# Patient Record
Sex: Female | Born: 1940 | Race: White | Hispanic: No | Marital: Single | Smoking: Former smoker
Health system: Southern US, Community
[De-identification: ages and names within clinical notes are randomized; demographics above are authoritative.]

## PROBLEM LIST (undated history)

## (undated) DIAGNOSIS — Z923 Personal history of irradiation: Secondary | ICD-10-CM

## (undated) DIAGNOSIS — C50919 Malignant neoplasm of unspecified site of unspecified female breast: Secondary | ICD-10-CM

## (undated) DIAGNOSIS — IMO0001 Reserved for inherently not codable concepts without codable children: Secondary | ICD-10-CM

## (undated) DIAGNOSIS — IMO0002 Reserved for concepts with insufficient information to code with codable children: Secondary | ICD-10-CM

## (undated) DIAGNOSIS — Z9889 Other specified postprocedural states: Secondary | ICD-10-CM

## (undated) DIAGNOSIS — F329 Major depressive disorder, single episode, unspecified: Secondary | ICD-10-CM

## (undated) DIAGNOSIS — Z9289 Personal history of other medical treatment: Secondary | ICD-10-CM

## (undated) DIAGNOSIS — E039 Hypothyroidism, unspecified: Secondary | ICD-10-CM

## (undated) DIAGNOSIS — F32A Depression, unspecified: Secondary | ICD-10-CM

## (undated) HISTORY — PX: TONSILLECTOMY: SUR1361

## (undated) HISTORY — DX: Personal history of other medical treatment: Z92.89

## (undated) HISTORY — DX: Malignant neoplasm of unspecified site of unspecified female breast: C50.919

## (undated) HISTORY — PX: ABDOMINAL HYSTERECTOMY: SHX81

## (undated) HISTORY — PX: EYE SURGERY: SHX253

## (undated) HISTORY — PX: CATARACT EXTRACTION: SUR2

## (undated) HISTORY — DX: Other specified postprocedural states: Z98.890

---

## 2003-12-31 ENCOUNTER — Ambulatory Visit (HOSPITAL_COMMUNITY): Admission: RE | Admit: 2003-12-31 | Discharge: 2004-01-01 | Payer: Self-pay | Admitting: Ophthalmology

## 2005-04-16 ENCOUNTER — Other Ambulatory Visit: Admission: RE | Admit: 2005-04-16 | Discharge: 2005-04-16 | Payer: Self-pay | Admitting: Internal Medicine

## 2005-04-22 ENCOUNTER — Encounter: Admission: RE | Admit: 2005-04-22 | Discharge: 2005-04-22 | Payer: Self-pay | Admitting: Internal Medicine

## 2006-04-25 ENCOUNTER — Encounter: Admission: RE | Admit: 2006-04-25 | Discharge: 2006-04-25 | Payer: Self-pay | Admitting: Internal Medicine

## 2007-04-26 ENCOUNTER — Encounter: Admission: RE | Admit: 2007-04-26 | Discharge: 2007-04-26 | Payer: Self-pay | Admitting: Internal Medicine

## 2008-05-01 ENCOUNTER — Encounter: Admission: RE | Admit: 2008-05-01 | Discharge: 2008-05-01 | Payer: Self-pay | Admitting: Internal Medicine

## 2009-05-02 ENCOUNTER — Encounter: Admission: RE | Admit: 2009-05-02 | Discharge: 2009-05-02 | Payer: Self-pay | Admitting: Internal Medicine

## 2010-03-01 ENCOUNTER — Encounter: Payer: Self-pay | Admitting: Internal Medicine

## 2010-04-01 ENCOUNTER — Other Ambulatory Visit: Payer: Self-pay | Admitting: Internal Medicine

## 2010-04-01 DIAGNOSIS — Z1231 Encounter for screening mammogram for malignant neoplasm of breast: Secondary | ICD-10-CM

## 2010-05-04 ENCOUNTER — Ambulatory Visit
Admission: RE | Admit: 2010-05-04 | Discharge: 2010-05-04 | Disposition: A | Discharge status: Home / Self Care | Payer: Medicare Other | Source: Ambulatory Visit | Attending: Internal Medicine | Admitting: Internal Medicine

## 2010-05-04 DIAGNOSIS — Z1231 Encounter for screening mammogram for malignant neoplasm of breast: Secondary | ICD-10-CM

## 2010-06-26 NOTE — Op Note (Signed)
NAMESOKHA, CRAKER NO.:  [id number]   MEDICAL RECORD NO.:  [id number]          PATIENT TYPE:  OIB   LOCATION:  2899                         FACILITY:  [facility]   PHYSICIAN:  Beulah Gandy. Ashley Royalty, M.D. DATE OF BIRTH:  [birth year]   DATE OF PROCEDURE:  12/31/2003  DATE OF DISCHARGE:                                 OPERATIVE REPORT   PREOPERATIVE DIAGNOSIS:  Vitreous hemorrhage with retinal break and small  detachment, right eye.   POSTOPERATIVE DIAGNOSIS:  Vitreous hemorrhage with retinal break and small  detachment, right eye.   PROCEDURE:  Pars plana vitrectomy with 25 gauge, panretinal  photocoagulation, membrane peel, and Perfluoropropane injection, 0.2 mL,  50%, right eye.   SURGEON:  Beulah Gandy. Ashley Royalty, M.D.   ASSISTANT:  Rosalie Doctor, MA   ANESTHESIA:  General.   DETAILS:  The usual prep and drape, 25 gauge trocar insertion at 8, 10, and  2 o'clock, the infusion line was placed at 8 o'clock.  The 25 gauge cutter  and light pipe were placed at 2 and 10 o'clock respectively.  A pars plana  vitrectomy was begun just behind the crystalline lens.  Vitreous membranes  were encountered and carefully removed under low suction and rapid cutting.  Much old blood and new blood was encountered.  Large clots were seen at 6  o'clock.  Blood was peeled from its attachments to the back of the lens and  peeled from its attachments in the far periphery.  The vitrectomy was  carried out for 360 degrees and scleral depression was used for membrane  peeling and cutting.  Once all the blood was removed from the vitreous, the  endolaser was positioned in the eye and a total of 778 burns were placed  around the retinal break at a power of 800 milliwatts, 1000 microns each,  and 0.1 seconds each.  Wash out procedure was performed with blood vacuumed  from the retinal surface.  The instruments and trocars were removed from the  eye and the wounds were tested for tightness, they were  found to be tight  without suture.  Perfluoropropane 50%, 0.2 mL, was injected to the pars  plana at 2 o'clock to reinflate the globe and press on the retinal break.  The indirect ophthalmoscope laser was moved into place and the laser was  supplemented around the retinal break.  Polymixin and gentamicin were  irrigated into the tenon's space.  Atropine solution was applied.  Decadron  10 mg was injected into the lower subconjunctival space.  Marcaine was  injected around the globe for postop pain.  The closing tension was 10 with  a Banker.  Polysporin, a patch, and a shield were placed.  The  patient was awakened and taken to the recovery room in satisfactory  condition.      John   JDM/MEDQ  D:  12/31/2003  T:  12/31/2003  Job:  [id number]

## 2011-04-15 ENCOUNTER — Other Ambulatory Visit: Payer: Self-pay | Admitting: Internal Medicine

## 2011-04-15 DIAGNOSIS — Z1231 Encounter for screening mammogram for malignant neoplasm of breast: Secondary | ICD-10-CM

## 2011-04-28 DIAGNOSIS — E782 Mixed hyperlipidemia: Secondary | ICD-10-CM | POA: Diagnosis not present

## 2011-04-28 DIAGNOSIS — E039 Hypothyroidism, unspecified: Secondary | ICD-10-CM | POA: Diagnosis not present

## 2011-04-28 DIAGNOSIS — G43009 Migraine without aura, not intractable, without status migrainosus: Secondary | ICD-10-CM | POA: Diagnosis not present

## 2011-04-28 DIAGNOSIS — F329 Major depressive disorder, single episode, unspecified: Secondary | ICD-10-CM | POA: Diagnosis not present

## 2011-04-28 DIAGNOSIS — Z0000 Encounter for general adult medical examination without abnormal findings: Secondary | ICD-10-CM | POA: Diagnosis not present

## 2011-05-05 ENCOUNTER — Ambulatory Visit
Admission: RE | Admit: 2011-05-05 | Discharge: 2011-05-05 | Disposition: A | Discharge status: Home / Self Care | Payer: Medicare Other | Source: Ambulatory Visit | Attending: Internal Medicine | Admitting: Internal Medicine

## 2011-05-05 DIAGNOSIS — E039 Hypothyroidism, unspecified: Secondary | ICD-10-CM | POA: Diagnosis not present

## 2011-05-05 DIAGNOSIS — E782 Mixed hyperlipidemia: Secondary | ICD-10-CM | POA: Diagnosis not present

## 2011-05-05 DIAGNOSIS — F329 Major depressive disorder, single episode, unspecified: Secondary | ICD-10-CM | POA: Diagnosis not present

## 2011-05-05 DIAGNOSIS — Z1231 Encounter for screening mammogram for malignant neoplasm of breast: Secondary | ICD-10-CM | POA: Diagnosis not present

## 2011-05-05 DIAGNOSIS — G43009 Migraine without aura, not intractable, without status migrainosus: Secondary | ICD-10-CM | POA: Diagnosis not present

## 2011-05-10 ENCOUNTER — Other Ambulatory Visit: Payer: Self-pay | Admitting: Internal Medicine

## 2011-05-10 DIAGNOSIS — R928 Other abnormal and inconclusive findings on diagnostic imaging of breast: Secondary | ICD-10-CM

## 2011-05-24 ENCOUNTER — Ambulatory Visit
Admission: RE | Admit: 2011-05-24 | Discharge: 2011-05-24 | Disposition: A | Discharge status: Home / Self Care | Payer: Medicare Other | Source: Ambulatory Visit | Attending: Internal Medicine | Admitting: Internal Medicine

## 2011-05-24 ENCOUNTER — Other Ambulatory Visit: Payer: Self-pay | Admitting: Internal Medicine

## 2011-05-24 DIAGNOSIS — R928 Other abnormal and inconclusive findings on diagnostic imaging of breast: Secondary | ICD-10-CM

## 2011-05-24 DIAGNOSIS — N6310 Unspecified lump in the right breast, unspecified quadrant: Secondary | ICD-10-CM

## 2011-05-24 DIAGNOSIS — N63 Unspecified lump in unspecified breast: Secondary | ICD-10-CM | POA: Diagnosis not present

## 2011-05-26 ENCOUNTER — Ambulatory Visit
Admission: RE | Admit: 2011-05-26 | Discharge: 2011-05-26 | Disposition: A | Discharge status: Home / Self Care | Payer: Medicare Other | Source: Ambulatory Visit | Attending: Internal Medicine | Admitting: Internal Medicine

## 2011-05-26 ENCOUNTER — Other Ambulatory Visit: Payer: Self-pay | Admitting: Internal Medicine

## 2011-05-26 DIAGNOSIS — C50019 Malignant neoplasm of nipple and areola, unspecified female breast: Secondary | ICD-10-CM | POA: Diagnosis not present

## 2011-05-26 DIAGNOSIS — N6310 Unspecified lump in the right breast, unspecified quadrant: Secondary | ICD-10-CM

## 2011-05-26 DIAGNOSIS — N63 Unspecified lump in unspecified breast: Secondary | ICD-10-CM | POA: Diagnosis not present

## 2011-05-26 HISTORY — PX: BREAST BIOPSY: SHX20

## 2011-05-27 ENCOUNTER — Other Ambulatory Visit: Payer: Self-pay | Admitting: Internal Medicine

## 2011-05-27 ENCOUNTER — Ambulatory Visit
Admission: RE | Admit: 2011-05-27 | Discharge: 2011-05-27 | Disposition: A | Discharge status: Home / Self Care | Payer: Medicare Other | Source: Ambulatory Visit | Attending: Internal Medicine | Admitting: Internal Medicine

## 2011-05-27 DIAGNOSIS — N6310 Unspecified lump in the right breast, unspecified quadrant: Secondary | ICD-10-CM

## 2011-05-27 DIAGNOSIS — C50911 Malignant neoplasm of unspecified site of right female breast: Secondary | ICD-10-CM

## 2011-05-27 DIAGNOSIS — N63 Unspecified lump in unspecified breast: Secondary | ICD-10-CM | POA: Diagnosis not present

## 2011-05-28 ENCOUNTER — Telehealth: Payer: Self-pay | Admitting: *Deleted

## 2011-05-28 ENCOUNTER — Other Ambulatory Visit: Payer: Self-pay | Admitting: *Deleted

## 2011-05-28 DIAGNOSIS — C50119 Malignant neoplasm of central portion of unspecified female breast: Secondary | ICD-10-CM | POA: Insufficient documentation

## 2011-05-28 NOTE — Telephone Encounter (Signed)
Confirmed [facility] for 06/02/11 at 0800.  Instructions and contact information given.

## 2011-06-01 ENCOUNTER — Ambulatory Visit
Admission: RE | Admit: 2011-06-01 | Discharge: 2011-06-01 | Disposition: A | Discharge status: Home / Self Care | Payer: Medicare Other | Source: Ambulatory Visit | Attending: Internal Medicine | Admitting: Internal Medicine

## 2011-06-01 DIAGNOSIS — C50919 Malignant neoplasm of unspecified site of unspecified female breast: Secondary | ICD-10-CM | POA: Diagnosis not present

## 2011-06-01 DIAGNOSIS — C50911 Malignant neoplasm of unspecified site of right female breast: Secondary | ICD-10-CM

## 2011-06-01 MED ORDER — GADOBENATE DIMEGLUMINE 529 MG/ML IV SOLN
12.0000 mL | Freq: Once | INTRAVENOUS | Status: AC | PRN
  Administered 2011-06-01: 12 mL via INTRAVENOUS

## 2011-06-02 ENCOUNTER — Encounter: Payer: Self-pay | Admitting: Oncology

## 2011-06-02 ENCOUNTER — Ambulatory Visit (HOSPITAL_BASED_OUTPATIENT_CLINIC_OR_DEPARTMENT_OTHER): Payer: Medicare Other | Admitting: Oncology

## 2011-06-02 ENCOUNTER — Ambulatory Visit (HOSPITAL_BASED_OUTPATIENT_CLINIC_OR_DEPARTMENT_OTHER): Payer: Medicare Other | Admitting: Surgery

## 2011-06-02 ENCOUNTER — Ambulatory Visit: Payer: Medicare Other

## 2011-06-02 ENCOUNTER — Ambulatory Visit
Admission: RE | Admit: 2011-06-02 | Discharge: 2011-06-02 | Disposition: A | Discharge status: Home / Self Care | Payer: Medicare Other | Source: Ambulatory Visit | Attending: Radiation Oncology | Admitting: Radiation Oncology

## 2011-06-02 ENCOUNTER — Telehealth: Payer: Self-pay | Admitting: Oncology

## 2011-06-02 ENCOUNTER — Other Ambulatory Visit (INDEPENDENT_AMBULATORY_CARE_PROVIDER_SITE_OTHER): Payer: Self-pay | Admitting: Surgery

## 2011-06-02 ENCOUNTER — Ambulatory Visit: Payer: Medicare Other | Attending: Surgery | Admitting: Physical Therapy

## 2011-06-02 ENCOUNTER — Encounter: Payer: Self-pay | Admitting: *Deleted

## 2011-06-02 ENCOUNTER — Other Ambulatory Visit: Payer: Medicare Other | Admitting: Lab

## 2011-06-02 VITALS — BP 171/82 | HR 89 | Temp 97.6°F | Ht 61.2 in | Wt 134.2 lb

## 2011-06-02 DIAGNOSIS — R293 Abnormal posture: Secondary | ICD-10-CM | POA: Insufficient documentation

## 2011-06-02 DIAGNOSIS — C50919 Malignant neoplasm of unspecified site of unspecified female breast: Secondary | ICD-10-CM

## 2011-06-02 DIAGNOSIS — C50119 Malignant neoplasm of central portion of unspecified female breast: Secondary | ICD-10-CM

## 2011-06-02 DIAGNOSIS — IMO0001 Reserved for inherently not codable concepts without codable children: Secondary | ICD-10-CM | POA: Insufficient documentation

## 2011-06-02 LAB — COMPREHENSIVE METABOLIC PANEL
Albumin: 3.7 g/dL (ref 3.5–5.2)
Calcium: 9.5 mg/dL (ref 8.4–10.5)
Glucose, Bld: 71 mg/dL (ref 70–99)
Sodium: 141 mEq/L (ref 135–145)
Total Bilirubin: 0.3 mg/dL (ref 0.3–1.2)

## 2011-06-02 LAB — CBC WITH DIFFERENTIAL/PLATELET
BASO%: 1.6 % (ref 0.0–2.0)
Basophils Absolute: 0.1 10*3/uL (ref 0.0–0.1)
Eosinophils Absolute: 0.1 10*3/uL (ref 0.0–0.5)
HCT: 40.2 % (ref 34.8–46.6)
HGB: 13.3 g/dL (ref 11.6–15.9)
NEUT#: 3.1 10*3/uL (ref 1.5–6.5)
NEUT%: 57.3 % (ref 38.4–76.8)

## 2011-06-02 LAB — CANCER ANTIGEN 27.29: CA 27.29: 23 U/mL (ref 0–39)

## 2011-06-02 NOTE — Progress Notes (Signed)
Mailed after appt letter to pt. 

## 2011-06-02 NOTE — Progress Notes (Signed)
Samantha Weeks [id number] [birth year] 71 y.o. 06/02/2011 11:14 AM  CC Dr. Tyson Dense Dr. Ovidio Kin Dr. 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  STAGE:   Cancer of central portion of female breast   Primary site: Breast (Right)   Staging method: AJCC 7th Edition   Clinical: Stage IA (T1c, N0, cM0)   Summary: Stage IA (T1c, N0, cM0)  REFERRING PHYSICIAN: Dr. Tyson Dense  HISTORY OF PRESENT ILLNESS:  Samantha Weeks is a 71 y.o. female.  With medical history significant for hypothyroidism and depression. Patient recently presented for a screening mammogram on 05/27/2011. The mammogram showed a small spiculated mass in the right breast in the subareolar portion with nipple retraction. She went on to have an ultrasound performed with an ultrasound-guided core needle biopsy. The pathology showed a grade 1 invasive ductal carcinoma that was ER positive PR positive HER-2/neu negative. Proliferation marker was 10%. The patient then went on to have an MRI of the breasts performed that showed the mass to the measure out at 1.5 cm. Her case was discussed at the multidisciplinary breast conference and she is now being seen in the multidisciplinary breast clinic for discussion of treatment options. She is without any complaints today.   Past Medical History: Past Medical History  Diagnosis Date  . Breast cancer   . Hx of colonoscopy   . Hx of bone density study     Past Surgical History: Past Surgical History  Procedure Date  . Abdominal hysterectomy   . Cataract extraction   . Tonsillectomy     Family History: Family History  Problem Relation Age of Onset  . Breast cancer Sister     Social History Patient works as a Psychologist, forensic she has a 61 some a 17-year-old daughter she is single she quit smoking about 25 years ago she drinks  about 3-5 glasses of wine on the average. History  Substance Use Topics  . Smoking status: Former Games developer  . Smokeless tobacco: Not on file  . Alcohol Use: 3.0 oz/week    5 Glasses of wine per week    Allergies: No Known Allergies  Current Medications: Current Outpatient Prescriptions  Medication Sig Dispense Refill  . calcium citrate (CALCITRATE - DOSED IN MG ELEMENTAL CALCIUM) 950 MG tablet Take 1 tablet by mouth daily.      . calcium-vitamin D (OSCAL WITH D) 500-200 MG-UNIT per tablet Take 2 tablets by mouth daily.      . fish oil-omega-3 fatty acids 1000 MG capsule Take 1 g by mouth daily.      Marland Kitchen levothyroxine (SYNTHROID, LEVOTHROID) 50 MCG tablet Take 50 mcg by mouth daily.      . Multiple Vitamin (MULTIVITAMIN) tablet Take 1 tablet by mouth daily.      . nortriptyline (PAMELOR) 50 MG capsule Take 100 mg by mouth daily.        OB/GYN History: Menarche at age 84 she underwent menopause about 20 years ago she did take hormone replacement therapy for about 6-7 years she has been off of it for about 15-20 years she had her first child at age 12. She has had her colonoscopy about 6 years ago bone density about 5 years ago and she has another one scheduled next week her last Pap smear was 8 years ago first mammogram started about 20 years ago.  ECOG PERFORMANCE  STATUS: 0 - Asymptomatic  Genetic Counseling/testing: Family history was done patient has one sister who had breast cancer about 8 years ago patient's father is deceased at age 52 mother deceased in her 40s patient has 3 sisters and one brother there is no early onset breast cancers no ovarian cancers. Therefore genetic counseling and testing is not recommended at this time based on the history that is presented to [place].  REVIEW OF SYSTEMS: On review of systems patient overall feels well she denies any headaches double vision blurring of vision difficulty in swallowing she has not had any dizziness no headaches she denies any  problems with her mass she has no hearing deficits she has no shortness of breath no chest pains no cough hemoptysis hematemesis no palpitations or chest pains no dominant no pain no diarrhea or constipation she denies any peripheral paresthesias no back pain no joint pain. She has no myalgias. She denies having any hematuria hematochezia melena hemoptysis hematemesis. She has not noticed any masses in her neck under her arms or groin region and she in fact cannot palpate the lump in her breast. She has not noticed any skin changes. Remainder of the 14 point review of systems is negative.  PHYSICAL EXAMINATION: Blood pressure 171/82, pulse 89, temperature 97.6 F (36.4 C), height 5' 1.2" (1.554 m), weight 134 lb 3.2 oz (60.873 kg).  HKV:QQVZD, healthy, no distress, well nourished and well developed SKIN: skin color, texture, turgor are normal HEAD: Normocephalic EYES: PERRLA, EOMI, sclera clear EARS: External ears normal OROPHARYNX:no exudate, no erythema and lips, buccal mucosa, and tongue normal  NECK: supple, no adenopathy, thyroid normal size, non-tender, without nodularity LYMPH:  no palpable lymphadenopathy, no hepatosplenomegaly BREAST:Right breast reveals an area of ecchymosis there is a palpable mass measuring about a centimeter or so. No other masses the nipple is retracted left breast no masses or nipple discharge. LUNGS: clear to auscultation , and palpation HEART: regular rate & rhythm, no murmurs and no gallops ABDOMEN:abdomen soft, non-tender, normal bowel sounds and no masses or organomegaly BACK: Back symmetric, no curvature., No CVA tenderness, Range of motion is normal EXTREMITIES:no edema, no clubbing, no cyanosis  NEURO: alert & oriented x 3 with fluent speech, no focal motor/sensory deficits, gait normal   STUDIES/RESULTS: US Breast Right  05/24/2011  *RADIOLOGY REPORT*  Clinical Data:  Recall from screening mammography.  DIGITAL DIAGNOSTIC RIGHT BREAST MAMMOGRAM WITH  CAD AND RIGHT BREAST ULTRASOUND:  Comparison:  Prior studies  Findings:  Spot compression views of the right breast demonstrate a small, spiculated mass within the subareolar portion of the right breast with mild retraction of the nipple.  On physical exam, there is mild inversion of the right nipple when compared to the left side.  There is no discrete palpable mass within the subareolar portion of the right breast  Ultrasound is performed, showing an irregular hypoechoic mass with shadowing located within the subareolar portion of the right breast.  This measures 10 x 10 x 8 mm in size.  This is worrisome for invasive mammary carcinoma and tissue sampling is recommended. I have discussed ultrasound-guided core biopsy with the patient. This will be scheduled per patient preference.  Ultrasound the right axilla demonstrates normal axillary contents.  IMPRESSION: 10 mm irregular hypoechoic mass located within the subareolar portion of the right breast worrisome for possible invasive mammary carcinoma.  Tissue sampling is recommended and ultrasound-guided core biopsy is scheduled for 05/26/2011.  BI-RADS CATEGORY 5:  Highly suggestive of malignancy -  appropriate action should be taken.  Original Report Authenticated By: Rolla Plate, M.D.   Mr Breast Bilateral W Wo Contrast  06/01/2011  *RADIOLOGY REPORT*  Clinical Data: New diagnosis right-sided breast cancer  BILATERAL BREAST MRI WITH AND WITHOUT CONTRAST  Technique: Multiplanar, multisequence MR images of both breasts were obtained prior to and following the intravenous administration of 12ml of Multihance.  Three dimensional images were evaluated at the independent DynaCad workstation.  Comparison:  Most recent mammogram dated 05/05/2011  Findings: A round, homogeneously enhancing mass with plateau kinetics and irregular margins is imaged in the right subareolar region with associated retraction of the right nipple measuring 1.5 x 1.1 x 1.2 cm.  A biopsy  clip is seen in association with the mass.  No additional mass or suspicious enhancement seen in either breast.  No axillary or internal mammary adenopathy is identified.  IMPRESSION: Known malignancy, right breast as detailed above.  No MRI specific evidence of malignancy, left breast.  THREE-DIMENSIONAL MR IMAGE RENDERING ON INDEPENDENT WORKSTATION:  Three-dimensional MR images were rendered by post-processing of the original MR data on an independent workstation.  The three- dimensional MR images were interpreted, and findings were reported in the accompanying complete MRI report for this study.  BI-RADS CATEGORY 6:  Known biopsy-proven malignancy - appropriate action should be taken.  Original Report Authenticated By: Hiram Gash, M.D.   [place] Core Biopsy  05/26/2011  *RADIOLOGY REPORT*  Clinical Data:  Right breast mass.  ULTRASOUND GUIDED CORE BIOPSY OF THE right BREAST  Comparison: Prior studies  I met with the patient and we discussed the procedure of ultrasound- guided biopsy, including benefits and alternatives.  We discussed the high likelihood of a successful procedure. We discussed the risks of the procedure, including infection, bleeding, tissue injury, clip migration, and inadequate sampling.  Informed written consent was given.  Using sterile technique 2% lidocaine, ultrasound guidance and a 14 gauge automated biopsy device, biopsy was performed of the mass located within the subareolar portion of the right breast.  At the conclusion of the procedure a coil shaped tissue marker clip was deployed into the biopsy cavity.  Follow up 2 view mammogram was performed and dictated separately.  IMPRESSION: Ultrasound guided biopsy of the right breast mass located in the subareolar location.  No apparent complications.  Original Report Authenticated By: Rolla Plate, M.D.   Mm Digital Diag Ltd R  05/24/2011  *RADIOLOGY REPORT*  Clinical Data:  Recall from screening mammography.  DIGITAL  DIAGNOSTIC RIGHT BREAST MAMMOGRAM WITH CAD AND RIGHT BREAST ULTRASOUND:  Comparison:  Prior studies  Findings:  Spot compression views of the right breast demonstrate a small, spiculated mass within the subareolar portion of the right breast with mild retraction of the nipple.  On physical exam, there is mild inversion of the right nipple when compared to the left side.  There is no discrete palpable mass within the subareolar portion of the right breast  Ultrasound is performed, showing an irregular hypoechoic mass with shadowing located within the subareolar portion of the right breast.  This measures 10 x 10 x 8 mm in size.  This is worrisome for invasive mammary carcinoma and tissue sampling is recommended. I have discussed ultrasound-guided core biopsy with the patient. This will be scheduled per patient preference.  Ultrasound the right axilla demonstrates normal axillary contents.  IMPRESSION: 10 mm irregular hypoechoic mass located within the subareolar portion of the right breast worrisome for possible invasive mammary carcinoma.  Tissue sampling is  recommended and ultrasound-guided core biopsy is scheduled for 05/26/2011.  BI-RADS CATEGORY 5:  Highly suggestive of malignancy - appropriate action should be taken.  Original Report Authenticated By: Rolla Plate, M.D.   Mm Digital Diagnostic Unilat R  05/26/2011  *RADIOLOGY REPORT*  Clinical Data:  Post right breast ultrasound guided core biopsy.  DIGITAL DIAGNOSTIC RIGHT BREAST MAMMOGRAM  Comparison:  Prior studies  Findings:  Films are performed following ultrasound guided biopsy of the mass within the right breast located in a subareolar location.  The coil shaped clip is located in appropriate position along the lateral border of the mass.  IMPRESSION: Coil shaped clip located in appropriate position along the lateral border of the subareolar mass.  Original Report Authenticated By: Rolla Plate, M.D.   Mm Digital Screening  05/05/2011  DG  SCREEN MAMMOGRAM BILATERAL Bilateral CC and MLO view(s) were taken.  DIGITAL SCREENING MAMMOGRAM WITH CAD: There are scattered fibroglandular densities.  A possible mass is noted in the right breast.  Spot  compression views and possibly sonography are recommended for further evaluation.  In the left  breast, no masses or malignant type calcifications are identified.  Compared with prior studies.  Images were processed with CAD.  IMPRESSION: Possible mass, right breast.  Additional evaluation is indicated.  The patient will be contacted  for additional studies and a supplementary report will follow.  No specific mammographic evidence  of malignancy, left breast.  ASSESSMENT: Need additional imaging evaluation and/or prior mammograms for comparison - BI-RADS 0  Further imaging of the right breast. ,   Mm Radiologist Eval And Mgmt  05/27/2011  *RADIOLOGY REPORT*  ESTABLISHED PATIENT OFFICE VISIT - LEVEL II ([id number])  Chief Complaint:  The patient returns for pathology results and biopsy check.  History:  The patient has a subareolar right breast mass.  Status post ultrasound guided core biopsy on 05/26/2011.  The patient reports doing well overnight.  She is had no significant bleeding or pain.  Exam:  Physical exam shows no significant hematoma in the right breast.  Biopsy site is clean and dry.  Steri-Strips remain in place.  There is minimal ecchymosis at the biopsy site.  Pathology results:  Pathology shows invasive ductal carcinoma, correlating well with the imaging appearance. Prognostic panel is pending.  Assessment and Plan:  The patient is scheduled to be seen at multidisciplinary clinic 06/02/2011.  MRI scheduled 06/01/2011.  Original Report Authenticated By: Patterson Hammersmith, M.D.     LABS:    Chemistry      Component Value Date/Time   NA 141 06/02/2011 0814   K 3.7 06/02/2011 0814   CL 103 06/02/2011 0814   CO2 29 06/02/2011 0814   BUN 17 06/02/2011 0814   CREATININE 0.89 06/02/2011 0814        Component Value Date/Time   CALCIUM 9.5 06/02/2011 0814   ALKPHOS 79 06/02/2011 0814   AST 25 06/02/2011 0814   ALT 20 06/02/2011 0814   BILITOT 0.3 06/02/2011 0814      Lab Results  Component Value Date   WBC 5.4 06/02/2011   HGB 13.3 06/02/2011   HCT 40.2 06/02/2011   MCV 86.1 06/02/2011   PLT 311 06/02/2011       PATHOLOGY:  DIAGNOSIS Diagnosis Breast, right, needle core biopsy, subareolar INVASIVE DUCTAL CARCINOMA. Microscopic Comment The core biopsies are involved by grade I invasive ductal carcinoma, with the largest involvement with any single core being 1 cm. Breast prognostic profile will be performed and reported  as an addendum. Dr. Colonel Bald agrees. Called to [facility] on 05/27/11. (JDP:eps 05/27/11) Jimmy Picket MD Pathologist, Electronic Signature (Case signed 05/27/2011) Specimen Gross and Clinical Inf   ASSESSMENT    71 year old female with 1.5 cm low grade invasive ductal carcinoma of the right breast presenting with a small spiculated mass in the subareolar region with no nipple retraction. The tumor was biopsied and was found to be a invasive ductal carcinoma grade 1 ER positive PR positive HER-2/neu negative with a low proliferation marker of 10%. She is now seen in the multidisciplinary breast clinic for treatment options. Per NCCN guidelines lump breast conservation with lumpectomy and sentinel node biopsy followed by radiation therapy and adjuvant antiestrogen therapy consisting of an aromatase inhibitor is recommended. We discussed this in great detail today. Patient was seen by Dr. Ovidio Kin as well as Dr. Lurline Hare.    PLAN:    I had an extensive discussion with the patient regarding adjuvant antiestrogen therapy specifically with an aromatase inhibitor. I discussed with her tamoxifen as well. She understands the risks and benefits of these drugs. However she will have her surgery first. After that I will plan on seeing her back we  will discuss her final pathology. If her pathology remains consistent and the size of the tumor remains about the same then she will be seen by Dr. Michell Heinrich for discussion of can of radiation therapy. Her antiestrogen therapy will be given after she completes radiation.      Thank you so much for allowing me to participate in the care of Samantha Weeks. I will continue to follow up the patient with you and assist in her care.  All questions were answered. The patient knows to call the clinic with any problems, questions or concerns. We can certainly see the patient much sooner if necessary.  I spent 60 minutes counseling the patient face to face. The total time spent in the appointment was 60 minutes.  Drue Second, MD Medical/Oncology [facility] [phone] (beeper) [phone] (Office)  06/02/2011, 11:14 AM 06/02/2011, 11:14 AM

## 2011-06-02 NOTE — Progress Notes (Signed)
Radiation Oncology         [phone] ________________________________  Initial outpatient Consultation  Name: Samantha Weeks MRN: [id number]  Date: 06/02/2011  DOB: 1940/04/21  CC:No primary provider on file.  Kandis Cocking, MD   REFERRING PHYSICIAN: Kandis Cocking, MD  DIAGNOSIS: T1N0 Right Breast Cancer  HISTORY OF PRESENT ILLNESS::Samantha Weeks is a 71 y.o. female who is referred today for my opinion regarding radiation in the management of her newly diagnosed breast cancer. Ms. Wildes underwent screening mammogram and was found to have a new right breast mass. On physical examination she was noted to have nipple retraction. Mammogram showed a 1 cm mass in the posterior areolar position. 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 Ki 67 was 10%. MRI of the bilateral breast showed a solitary lesion in the right breast measuring in maximal dimension 1.5 cm. She is accompanied by her friend today.   PREVIOUS RADIATION THERAPY: No  PAST MEDICAL HISTORY:  has no past medical history on file.    PAST SURGICAL HISTORY:No past surgical history on file.  FAMILY HISTORY: family history is not on file.  SOCIAL HISTORY:  does not have a smoking history on file. She does not have any smokeless tobacco history on file.  ALLERGIES: Review of patient's allergies indicates no known allergies.  MEDICATIONS:  Current Outpatient Prescriptions  Medication Sig Dispense Refill  . calcium citrate (CALCITRATE - DOSED IN MG ELEMENTAL CALCIUM) 950 MG tablet Take 1 tablet by mouth daily.      . calcium-vitamin D (OSCAL WITH D) 500-200 MG-UNIT per tablet Take 2 tablets by mouth daily.      . fish oil-omega-3 fatty acids 1000 MG capsule Take 1 g by mouth daily.      Marland Kitchen levothyroxine (SYNTHROID, LEVOTHROID) 50 MCG tablet Take 50 mcg by mouth daily.      . Multiple Vitamin (MULTIVITAMIN) tablet Take 1 tablet by  mouth daily.      . nortriptyline (PAMELOR) 50 MG capsule Take 100 mg by mouth daily.       No current facility-administered medications for this encounter.   Facility-Administered Medications Ordered in Other Encounters  Medication Dose Route Frequency Provider Last Rate Last Dose  . gadobenate dimeglumine (MULTIHANCE) injection 12 mL  12 mL Intravenous Once PRN Medication Radiologist, MD   12 mL at 06/01/11 1018    REVIEW OF SYSTEMS:  A 15 point review of systems is documented in the electronic medical record. This was obtained by the nursing staff. However, I reviewed this with the patient to discuss relevant findings and make appropriate changes.  Pertinent items are noted in HPI.   PHYSICAL EXAM:  Wt Readings from Last 3 Encounters:  06/02/11 134 lb 3.2 oz (60.873 kg)   Temp Readings from Last 3 Encounters:  06/02/11 97.6 F (36.4 C)    BP Readings from Last 3 Encounters:  06/02/11 171/82   Pulse Readings from Last 3 Encounters:  06/02/11 89   She is a pleasant female who appears younger than her stated age symptoms on examining table. She has small breasts bilaterally. Her right nipple is retracted as compared to her left. She has no palpable supraclavicular or cervical adenopathy. No palpable axillary adenopathy bilaterally. She has some small biopsy change palpable beneath her right nipple. She also has some bruising medially and laterally. Chest no palpable abnormalities of the left  breast. She is alert and oriented x3. She has a normal gait.  LABORATORY DATA:  Lab Results  Component Value Date   WBC 5.4 06/02/2011   HGB 13.3 06/02/2011   HCT 40.2 06/02/2011   MCV 86.1 06/02/2011   PLT 311 06/02/2011   Lab Results  Component Value Date   NA 141 06/02/2011   K 3.7 06/02/2011   CL 103 06/02/2011   CO2 29 06/02/2011   Lab Results  Component Value Date   ALT 20 06/02/2011   AST 25 06/02/2011   ALKPHOS 79 06/02/2011   BILITOT 0.3 06/02/2011     RADIOGRAPHY: US Breast  Right  05/24/2011  *RADIOLOGY REPORT*  Clinical Data:  Recall from screening mammography.  DIGITAL DIAGNOSTIC RIGHT BREAST MAMMOGRAM WITH CAD AND RIGHT BREAST ULTRASOUND:  Comparison:  Prior studies  Findings:  Spot compression views of the right breast demonstrate a small, spiculated mass within the subareolar portion of the right breast with mild retraction of the nipple.  On physical exam, there is mild inversion of the right nipple when compared to the left side.  There is no discrete palpable mass within the subareolar portion of the right breast  Ultrasound is performed, showing an irregular hypoechoic mass with shadowing located within the subareolar portion of the right breast.  This measures 10 x 10 x 8 mm in size.  This is worrisome for invasive mammary carcinoma and tissue sampling is recommended. I have discussed ultrasound-guided core biopsy with the patient. This will be scheduled per patient preference.  Ultrasound the right axilla demonstrates normal axillary contents.  IMPRESSION: 10 mm irregular hypoechoic mass located within the subareolar portion of the right breast worrisome for possible invasive mammary carcinoma.  Tissue sampling is recommended and ultrasound-guided core biopsy is scheduled for 05/26/2011.  BI-RADS CATEGORY 5:  Highly suggestive of malignancy - appropriate action should be taken.  Original Report Authenticated By: Rolla Plate, M.D.   Mr Breast Bilateral W Wo Contrast  06/01/2011  *RADIOLOGY REPORT*  Clinical Data: New diagnosis right-sided breast cancer  BILATERAL BREAST MRI WITH AND WITHOUT CONTRAST  Technique: Multiplanar, multisequence MR images of both breasts were obtained prior to and following the intravenous administration of 12ml of Multihance.  Three dimensional images were evaluated at the independent DynaCad workstation.  Comparison:  Most recent mammogram dated 05/05/2011  Findings: A round, homogeneously enhancing mass with plateau kinetics and irregular  margins is imaged in the right subareolar region with associated retraction of the right nipple measuring 1.5 x 1.1 x 1.2 cm.  A biopsy clip is seen in association with the mass.  No additional mass or suspicious enhancement seen in either breast.  No axillary or internal mammary adenopathy is identified.  IMPRESSION: Known malignancy, right breast as detailed above.  No MRI specific evidence of malignancy, left breast.  THREE-DIMENSIONAL MR IMAGE RENDERING ON INDEPENDENT WORKSTATION:  Three-dimensional MR images were rendered by post-processing of the original MR data on an independent workstation.  The three- dimensional MR images were interpreted, and findings were reported in the accompanying complete MRI report for this study.  BI-RADS CATEGORY 6:  Known biopsy-proven malignancy - appropriate action should be taken.  Original Report Authenticated By: Hiram Gash, M.D.   [place] Core Biopsy  05/26/2011  *RADIOLOGY REPORT*  Clinical Data:  Right breast mass.  ULTRASOUND GUIDED CORE BIOPSY OF THE right BREAST  Comparison: Prior studies  I met with the patient and we discussed the procedure of ultrasound- guided biopsy, including  benefits and alternatives.  We discussed the high likelihood of a successful procedure. We discussed the risks of the procedure, including infection, bleeding, tissue injury, clip migration, and inadequate sampling.  Informed written consent was given.  Using sterile technique 2% lidocaine, ultrasound guidance and a 14 gauge automated biopsy device, biopsy was performed of the mass located within the subareolar portion of the right breast.  At the conclusion of the procedure a coil shaped tissue marker clip was deployed into the biopsy cavity.  Follow up 2 view mammogram was performed and dictated separately.  IMPRESSION: Ultrasound guided biopsy of the right breast mass located in the subareolar location.  No apparent complications.  Original Report Authenticated By: Rolla Plate, M.D.   Mm Digital Diag Ltd R  05/24/2011  *RADIOLOGY REPORT*  Clinical Data:  Recall from screening mammography.  DIGITAL DIAGNOSTIC RIGHT BREAST MAMMOGRAM WITH CAD AND RIGHT BREAST ULTRASOUND:  Comparison:  Prior studies  Findings:  Spot compression views of the right breast demonstrate a small, spiculated mass within the subareolar portion of the right breast with mild retraction of the nipple.  On physical exam, there is mild inversion of the right nipple when compared to the left side.  There is no discrete palpable mass within the subareolar portion of the right breast  Ultrasound is performed, showing an irregular hypoechoic mass with shadowing located within the subareolar portion of the right breast.  This measures 10 x 10 x 8 mm in size.  This is worrisome for invasive mammary carcinoma and tissue sampling is recommended. I have discussed ultrasound-guided core biopsy with the patient. This will be scheduled per patient preference.  Ultrasound the right axilla demonstrates normal axillary contents.  IMPRESSION: 10 mm irregular hypoechoic mass located within the subareolar portion of the right breast worrisome for possible invasive mammary carcinoma.  Tissue sampling is recommended and ultrasound-guided core biopsy is scheduled for 05/26/2011.  BI-RADS CATEGORY 5:  Highly suggestive of malignancy - appropriate action should be taken.  Original Report Authenticated By: Rolla Plate, M.D.   Mm Digital Diagnostic Unilat R  05/26/2011  *RADIOLOGY REPORT*  Clinical Data:  Post right breast ultrasound guided core biopsy.  DIGITAL DIAGNOSTIC RIGHT BREAST MAMMOGRAM  Comparison:  Prior studies  Findings:  Films are performed following ultrasound guided biopsy of the mass within the right breast located in a subareolar location.  The coil shaped clip is located in appropriate position along the lateral border of the mass.  IMPRESSION: Coil shaped clip located in appropriate position along the  lateral border of the subareolar mass.  Original Report Authenticated By: Rolla Plate, M.D.   Mm Digital Screening  05/05/2011  DG SCREEN MAMMOGRAM BILATERAL Bilateral CC and MLO view(s) were taken.  DIGITAL SCREENING MAMMOGRAM WITH CAD: There are scattered fibroglandular densities.  A possible mass is noted in the right breast.  Spot  compression views and possibly sonography are recommended for further evaluation.  In the left  breast, no masses or malignant type calcifications are identified.  Compared with prior studies.  Images were processed with CAD.  IMPRESSION: Possible mass, right breast.  Additional evaluation is indicated.  The patient will be contacted  for additional studies and a supplementary report will follow.  No specific mammographic evidence  of malignancy, left breast.  ASSESSMENT: Need additional imaging evaluation and/or prior mammograms for comparison - BI-RADS 0  Further imaging of the right breast. ,   Mm Radiologist Eval And Mgmt  05/27/2011  *RADIOLOGY REPORT*  ESTABLISHED  PATIENT OFFICE VISIT - LEVEL II [phone])  Chief Complaint:  The patient returns for pathology results and biopsy check.  History:  The patient has a subareolar right breast mass.  Status post ultrasound guided core biopsy on 05/26/2011.  The patient reports doing well overnight.  She is had no significant bleeding or pain.  Exam:  Physical exam shows no significant hematoma in the right breast.  Biopsy site is clean and dry.  Steri-Strips remain in place.  There is minimal ecchymosis at the biopsy site.  Pathology results:  Pathology shows invasive ductal carcinoma, correlating well with the imaging appearance. Prognostic panel is pending.  Assessment and Plan:  The patient is scheduled to be seen at multidisciplinary clinic 06/02/2011.  MRI scheduled 06/01/2011.  Original Report Authenticated By: Patterson Hammersmith, M.D.      IMPRESSION: T1 N0 invasive ductal carcinoma of the right breast  PLAN: I spoke  with Ms. Boerema and her friend today regarding her diagnosis and options for treatment. We discussed the randomized trials showing the equivalency in terms of survival between lumpectomy and mastectomy. We discussed the role of radiation and decreasing local failure. We discussed the recent randomized trials showing a slight improvement in local control in patients with early stage tumors who were over the age of 34 received radiation in anti-hormonal treatment.  We discussed that there was no survival benefit to radiation all 4 hormones alone seen in these trials. We discussed the randomized [place] trials looking at a hypofractionated approach. I believe due to her tumor size and breast size she would be a candidate for hyperfractionated radiation and weekly get her treatment done in 3 weeks. We discussed that she could continue working. We discussed that she could get her treatment prior to going in to work. We discussed the process of simulation in the placement of tattoos. We discussed the possible risks of treatment including but not limited to skin irritation and damage to her normal lungs. We discussed the possibility of fatigue. We discussed it was also possible for her to have surgery radiation and antiestrogen treatment. I spent 60 minutes minutes face to face with the patient and more than 50% of that time was spent in counseling and/or coordination of care.   ------------------------------------------------

## 2011-06-02 NOTE — Patient Instructions (Signed)
1. I will see you back after the suegery

## 2011-06-02 NOTE — Progress Notes (Signed)
Patient came in today as a new patient she has two insurance,she said she think she will be [place].

## 2011-06-02 NOTE — Telephone Encounter (Signed)
lmonvm advising the pt of her md appt in may

## 2011-06-02 NOTE — Progress Notes (Addendum)
Re:   Samantha Weeks DOB:   [birth year] MRN:   [id number]  [facility]  ASSESSMENT AND PLAN: 1.  Right breast cancer, Central, behind the nipple. T1, N0.  Retracted nipple  1.5 cm on MRI  ER/PR - positive, Ki-67 - 10%  I discussed the options for breast cancer treatment with the patient.  I discussed the idea of a multidisciplinary approach to the treatment of breast cancer, which often includes medical oncology and radiation oncology.  I discussed the surgical options of lumpectomy vs. mastectomy.  I discussed the options of lymph node biopsy.    The risks of surgery include, but are not limited to, bleeding, infection, the need for further surgery, and nerve injury.  She wants to wait for her surgery until after she sees [employer].  She's seeing him in [place] and [place].  I discussed the SLNBx and its risks.  Seeing Drs. Park Breed and Michell Heinrich.  2.  Thyroid replacement.  REFERRING PHYSICIAN:  Dr. Burney Gauze  HISTORY OF PRESENT ILLNESS: Samantha Weeks is a 71 y.o. (DOB: [birth year])  white female whose primary care physician is Dr. Burney Gauze and comes [facility] today for right breast cancer.  She is accompanied by Samantha Weeks (MRN: [id number], a breast cancer patient of mine).  She had maybe noticed a change in her right nipple.  She went for a mammogram which showed a sub areolar abnormality.  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    She has a sister, who lives in [place], who had breast cancer at age 103.  Her sister is 10 years younger than her.  She had a lumpectomy.  She had a hysterectomy in the 1980's for bleeding.  She took estrogen for about 7 years after that, but then stopped taking it.   Past Medical History  Diagnosis Date  . Breast cancer   . Hx of colonoscopy   . Hx of bone density study     Past Surgical History  Procedure Date  . Abdominal hysterectomy   . Cataract extraction   . Tonsillectomy     Current Outpatient Prescriptions  Medication Sig Dispense  Refill  . calcium citrate (CALCITRATE - DOSED IN MG ELEMENTAL CALCIUM) 950 MG tablet Take 1 tablet by mouth daily.      . calcium-vitamin D (OSCAL WITH D) 500-200 MG-UNIT per tablet Take 2 tablets by mouth daily.      . fish oil-omega-3 fatty acids 1000 MG capsule Take 1 g by mouth daily.      Marland Kitchen levothyroxine (SYNTHROID, LEVOTHROID) 50 MCG tablet Take 50 mcg by mouth daily.      . Multiple Vitamin (MULTIVITAMIN) tablet Take 1 tablet by mouth daily.      . nortriptyline (PAMELOR) 50 MG capsule Take 100 mg by mouth daily.         No Known Allergies  REVIEW OF SYSTEMS: Skin:  No history of rash.  No history of abnormal moles. Infection:  No history of hepatitis or HIV.  No history of MRSA. Neurologic:  No history of stroke.  No history of seizure.  No history of headaches. Cardiac:  No history of hypertension. No history of heart disease.  No history of prior cardiac catheterization.  No history of seeing a cardiologist. Pulmonary:  Does not smoke cigarettes.  No asthma or bronchitis.  No OSA/CPAP.  Endocrine:  No diabetes. On thyroid replacement x 3 years. Gastrointestinal:  No history of stomach disease.  No history  of liver disease.  No history of gall bladder disease.  No history of pancreas disease.  No history of colon disease.  Colonoscopy in 2007.  Unknown gastroenterologist. Urologic:  No history of kidney stones.  No history of bladder infections. GYN:  Hysterectomy in 1980's for bleeding. Musculoskeletal:  No history of joint or back disease. Hematologic:  No bleeding disorder.  No history of anemia. Psycho-social:  The patient is oriented.   The patient has no obvious psychologic or social impairment to understanding our conversation and plan.  SOCIAL and FAMILY HISTORY: Divorced.  Lives by self. Samantha Weeks, a friend and breast cancer patient of mine, accompanied her today. No children. 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  PHYSICAL EXAM: Wt Readings from Last 3  Encounters:  06/02/11 134 lb 3.2 oz (60.873 kg)   Temp Readings from Last 3 Encounters:  06/02/11 97.6 F (36.4 C)    BP Readings from Last 3 Encounters:  06/02/11 171/82   Pulse Readings from Last 3 Encounters:  06/02/11 89   General: WN WF who is alert and generally healthy appearing.  HEENT: Normal. Pupils equal. Good dentition. Neck: Supple. No mass.  No thyroid mass.  Carotid pulse okay with no bruit. Lymph Nodes:  No supraclavicular or cervical nodes. Lungs: Clear to auscultation and symmetric breath sounds. Heart:  RRR. No murmur or rub. Breasts:  Right:  Slightly inverted right nipple.  Questionable fullness under nipple, but I am not sure that I feel a mass.  I think she would benefit from a needle loc.  Left:  No mass or area of concern.  Abdomen: Soft. No mass. No tenderness. No hernia. Normal bowel sounds.  No abdominal scars. Rectal: Not done. Extremities:  Good strength and ROM  in upper and lower extremities. Neurologic:  Grossly intact to motor and sensory function. Psychiatric: Has normal mood and affect. Behavior is normal.   DATA REVIEWED: Mammograms, path reports.  Ovidio Kin, MD,  [facility] [address]  [address]   [place], [place]    [address] Phone:  [phone] FAX:  [phone]

## 2011-06-07 ENCOUNTER — Telehealth: Payer: Self-pay | Admitting: *Deleted

## 2011-06-07 NOTE — Telephone Encounter (Signed)
Left vm for pt to return call regarding BMDC from 4/24.

## 2011-06-07 NOTE — Telephone Encounter (Signed)
Spoke with pt concerning BMDC from 4/24.  Pt denies questions or concerns regarding dx or treatment care plan.  Encourage pt to call with needs.  Confirmed surgery date and f/u appt with Dr. Welton Flakes.  Contact information given.

## 2011-06-09 ENCOUNTER — Encounter (HOSPITAL_BASED_OUTPATIENT_CLINIC_OR_DEPARTMENT_OTHER): Payer: Self-pay | Admitting: *Deleted

## 2011-06-15 ENCOUNTER — Ambulatory Visit
Admission: RE | Admit: 2011-06-15 | Discharge: 2011-06-15 | Disposition: A | Discharge status: Home / Self Care | Payer: Medicare Other | Source: Ambulatory Visit | Attending: Surgery | Admitting: Surgery

## 2011-06-15 ENCOUNTER — Ambulatory Visit (HOSPITAL_BASED_OUTPATIENT_CLINIC_OR_DEPARTMENT_OTHER): Payer: Medicare Other | Admitting: Anesthesiology

## 2011-06-15 ENCOUNTER — Encounter (HOSPITAL_BASED_OUTPATIENT_CLINIC_OR_DEPARTMENT_OTHER): Payer: Self-pay | Admitting: Anesthesiology

## 2011-06-15 ENCOUNTER — Ambulatory Visit (HOSPITAL_COMMUNITY)
Admission: RE | Admit: 2011-06-15 | Discharge: 2011-06-15 | Disposition: A | Discharge status: Home / Self Care | Payer: Medicare Other | Source: Ambulatory Visit | Attending: Surgery | Admitting: Surgery

## 2011-06-15 ENCOUNTER — Other Ambulatory Visit (INDEPENDENT_AMBULATORY_CARE_PROVIDER_SITE_OTHER): Payer: Self-pay | Admitting: Surgery

## 2011-06-15 ENCOUNTER — Ambulatory Visit (HOSPITAL_BASED_OUTPATIENT_CLINIC_OR_DEPARTMENT_OTHER)
Admission: RE | Admit: 2011-06-15 | Discharge: 2011-06-15 | Disposition: A | Discharge status: Home / Self Care | Payer: Medicare Other | Source: Ambulatory Visit | Attending: Surgery | Admitting: Surgery

## 2011-06-15 ENCOUNTER — Encounter (HOSPITAL_BASED_OUTPATIENT_CLINIC_OR_DEPARTMENT_OTHER): Payer: Self-pay | Admitting: *Deleted

## 2011-06-15 ENCOUNTER — Encounter (HOSPITAL_BASED_OUTPATIENT_CLINIC_OR_DEPARTMENT_OTHER)
Admission: RE | Disposition: A | Discharge status: Home / Self Care | Payer: Self-pay | Source: Ambulatory Visit | Attending: Surgery

## 2011-06-15 DIAGNOSIS — Z01812 Encounter for preprocedural laboratory examination: Secondary | ICD-10-CM | POA: Insufficient documentation

## 2011-06-15 DIAGNOSIS — Z87891 Personal history of nicotine dependence: Secondary | ICD-10-CM | POA: Diagnosis not present

## 2011-06-15 DIAGNOSIS — C50119 Malignant neoplasm of central portion of unspecified female breast: Secondary | ICD-10-CM

## 2011-06-15 DIAGNOSIS — C50919 Malignant neoplasm of unspecified site of unspecified female breast: Secondary | ICD-10-CM

## 2011-06-15 DIAGNOSIS — E039 Hypothyroidism, unspecified: Secondary | ICD-10-CM | POA: Insufficient documentation

## 2011-06-15 DIAGNOSIS — N63 Unspecified lump in unspecified breast: Secondary | ICD-10-CM | POA: Diagnosis not present

## 2011-06-15 HISTORY — DX: Major depressive disorder, single episode, unspecified: F32.9

## 2011-06-15 HISTORY — DX: Depression, unspecified: F32.A

## 2011-06-15 HISTORY — PX: BREAST SURGERY: SHX581

## 2011-06-15 HISTORY — PX: BREAST LUMPECTOMY: SHX2

## 2011-06-15 HISTORY — DX: Hypothyroidism, unspecified: E03.9

## 2011-06-15 SURGERY — BREAST LUMPECTOMY WITH NEEDLE LOCALIZATION AND AXILLARY SENTINEL LYMPH NODE BX
Anesthesia: General | Site: Breast | Laterality: Right | Wound class: Clean

## 2011-06-15 MED ORDER — BUPIVACAINE HCL (PF) 0.25 % IJ SOLN
INTRAMUSCULAR | Status: DC | PRN
  Administered 2011-06-15: 30 mL

## 2011-06-15 MED ORDER — TECHNETIUM TC 99M SULFUR COLLOID FILTERED
1.0000 | Freq: Once | INTRAVENOUS | Status: AC | PRN
  Administered 2011-06-15: 1 via INTRADERMAL

## 2011-06-15 MED ORDER — HYDROCODONE-ACETAMINOPHEN 5-325 MG PO TABS: 1.0000 | ORAL_TABLET | Freq: Four times a day (QID) | ORAL | Status: AC | PRN

## 2011-06-15 MED ORDER — CHLORHEXIDINE GLUCONATE 4 % EX LIQD: 1.0000 "application " | Freq: Once | CUTANEOUS | Status: DC

## 2011-06-15 MED ORDER — ONDANSETRON HCL 4 MG/2ML IJ SOLN
INTRAMUSCULAR | Status: DC | PRN
  Administered 2011-06-15: 4 mg via INTRAVENOUS

## 2011-06-15 MED ORDER — ATROPINE SULFATE 0.4 MG/ML IJ SOLN
INTRAMUSCULAR | Status: DC | PRN
  Administered 2011-06-15: 0.2 mg via INTRAVENOUS

## 2011-06-15 MED ORDER — FENTANYL CITRATE 0.05 MG/ML IJ SOLN
50.0000 ug | INTRAMUSCULAR | Status: DC | PRN
  Administered 2011-06-15: 50 ug via INTRAVENOUS

## 2011-06-15 MED ORDER — HYDROMORPHONE HCL PF 1 MG/ML IJ SOLN: 0.2500 mg | INTRAMUSCULAR | Status: DC | PRN

## 2011-06-15 MED ORDER — EPHEDRINE SULFATE 50 MG/ML IJ SOLN
INTRAMUSCULAR | Status: DC | PRN
  Administered 2011-06-15 (×2): 10 mg via INTRAVENOUS

## 2011-06-15 MED ORDER — DEXAMETHASONE SODIUM PHOSPHATE 4 MG/ML IJ SOLN
INTRAMUSCULAR | Status: DC | PRN
  Administered 2011-06-15: 10 mg via INTRAVENOUS

## 2011-06-15 MED ORDER — FENTANYL CITRATE 0.05 MG/ML IJ SOLN
INTRAMUSCULAR | Status: DC | PRN
  Administered 2011-06-15: 100 ug via INTRAVENOUS
  Administered 2011-06-15 (×2): 25 ug via INTRAVENOUS

## 2011-06-15 MED ORDER — CEFAZOLIN SODIUM 1-5 GM-% IV SOLN
1.0000 g | INTRAVENOUS | Status: AC
  Administered 2011-06-15: 1 g via INTRAVENOUS

## 2011-06-15 MED ORDER — LACTATED RINGERS IV SOLN
INTRAVENOUS | Status: DC
  Administered 2011-06-15 (×2): via INTRAVENOUS

## 2011-06-15 MED ORDER — HYDROCODONE-ACETAMINOPHEN 5-325 MG PO TABS
1.0000 | ORAL_TABLET | Freq: Once | ORAL | Status: AC
  Administered 2011-06-15: 1 via ORAL

## 2011-06-15 MED ORDER — SODIUM CHLORIDE 0.9 % IJ SOLN
INTRAMUSCULAR | Status: DC | PRN
  Administered 2011-06-15: 16:00:00 via INTRAMUSCULAR

## 2011-06-15 MED ORDER — PROPOFOL 10 MG/ML IV EMUL
INTRAVENOUS | Status: DC | PRN
  Administered 2011-06-15: 150 mg via INTRAVENOUS

## 2011-06-15 MED ORDER — MIDAZOLAM HCL 2 MG/2ML IJ SOLN
1.0000 mg | INTRAMUSCULAR | Status: DC | PRN
  Administered 2011-06-15: 1 mg via INTRAVENOUS

## 2011-06-15 SURGICAL SUPPLY — 60 items
APPLIER CLIP 11 MED OPEN (CLIP)
BANDAGE ELASTIC 6 VELCRO ST LF (GAUZE/BANDAGES/DRESSINGS) IMPLANT
BINDER BREAST LRG (GAUZE/BANDAGES/DRESSINGS) ×2 IMPLANT
BINDER BREAST MEDIUM (GAUZE/BANDAGES/DRESSINGS) IMPLANT
BINDER BREAST XLRG (GAUZE/BANDAGES/DRESSINGS) IMPLANT
BINDER BREAST XXLRG (GAUZE/BANDAGES/DRESSINGS) IMPLANT
BLADE SURG 10 STRL SS (BLADE) ×2 IMPLANT
BLADE SURG 15 STRL LF DISP TIS (BLADE) ×1 IMPLANT
BLADE SURG 15 STRL SS (BLADE) ×1
CANISTER SUCTION 1200CC (MISCELLANEOUS) ×2 IMPLANT
CHLORAPREP W/TINT 26ML (MISCELLANEOUS) ×2 IMPLANT
CLIP APPLIE 11 MED OPEN (CLIP) IMPLANT
CLIP TI WIDE RED SMALL 6 (CLIP) ×2 IMPLANT
CLOTH BEACON ORANGE TIMEOUT ST (SAFETY) ×2 IMPLANT
COVER MAYO STAND STRL (DRAPES) ×2 IMPLANT
COVER PROBE W GEL 5X96 (DRAPES) ×2 IMPLANT
COVER TABLE BACK 60X90 (DRAPES) ×2 IMPLANT
DECANTER SPIKE VIAL GLASS SM (MISCELLANEOUS) IMPLANT
DERMABOND ADVANCED (GAUZE/BANDAGES/DRESSINGS) ×1
DERMABOND ADVANCED .7 DNX12 (GAUZE/BANDAGES/DRESSINGS) ×1 IMPLANT
DEVICE DUBIN W/COMP PLATE 8390 (MISCELLANEOUS) ×2 IMPLANT
DRAIN CHANNEL 19F RND (DRAIN) IMPLANT
DRAIN HEMOVAC 1/8 X 5 (WOUND CARE) IMPLANT
DRAPE LAPAROSCOPIC ABDOMINAL (DRAPES) ×2 IMPLANT
DRAPE UTILITY XL STRL (DRAPES) ×2 IMPLANT
ELECT COATED BLADE 2.86 ST (ELECTRODE) ×2 IMPLANT
ELECT REM PT RETURN 9FT ADLT (ELECTROSURGICAL) ×2
ELECTRODE REM PT RTRN 9FT ADLT (ELECTROSURGICAL) ×1 IMPLANT
EVACUATOR SILICONE 100CC (DRAIN) IMPLANT
GAUZE SPONGE 4X4 12PLY STRL LF (GAUZE/BANDAGES/DRESSINGS) IMPLANT
GAUZE SPONGE 4X4 16PLY XRAY LF (GAUZE/BANDAGES/DRESSINGS) IMPLANT
GLOVE SKINSENSE NS SZ7.0 (GLOVE) ×1
GLOVE SKINSENSE STRL SZ7.0 (GLOVE) ×1 IMPLANT
GLOVE SURG SIGNA 7.5 PF LTX (GLOVE) ×4 IMPLANT
GOWN PREVENTION PLUS XLARGE (GOWN DISPOSABLE) ×2 IMPLANT
GOWN PREVENTION PLUS XXLARGE (GOWN DISPOSABLE) ×2 IMPLANT
KIT MARKER MARGIN INK (KITS) IMPLANT
NDL SAFETY ECLIPSE 18X1.5 (NEEDLE) ×1 IMPLANT
NEEDLE HYPO 18GX1.5 SHARP (NEEDLE) ×1
NEEDLE HYPO 25X1 1.5 SAFETY (NEEDLE) ×4 IMPLANT
NS IRRIG 1000ML POUR BTL (IV SOLUTION) ×2 IMPLANT
PACK BASIN DAY SURGERY FS (CUSTOM PROCEDURE TRAY) ×2 IMPLANT
PAD ALCOHOL SWAB (MISCELLANEOUS) ×2 IMPLANT
PENCIL BUTTON HOLSTER BLD 10FT (ELECTRODE) ×2 IMPLANT
PIN SAFETY STERILE (MISCELLANEOUS) IMPLANT
SHEET MEDIUM DRAPE 40X70 STRL (DRAPES) ×2 IMPLANT
SLEEVE SCD COMPRESS KNEE MED (MISCELLANEOUS) ×2 IMPLANT
SPONGE GAUZE 4X4 12PLY (GAUZE/BANDAGES/DRESSINGS) IMPLANT
SPONGE LAP 18X18 X RAY DECT (DISPOSABLE) ×2 IMPLANT
SUT ETHILON 2 0 FS 18 (SUTURE) IMPLANT
SUT MON AB 5-0 PS2 18 (SUTURE) ×2 IMPLANT
SUT SILK 3 0 TIES 17X18 (SUTURE)
SUT SILK 3-0 18XBRD TIE BLK (SUTURE) IMPLANT
SUT VICRYL 3-0 CR8 SH (SUTURE) ×2 IMPLANT
SYR CONTROL 10ML LL (SYRINGE) ×4 IMPLANT
TOWEL OR 17X24 6PK STRL BLUE (TOWEL DISPOSABLE) ×4 IMPLANT
TOWEL OR NON WOVEN STRL DISP B (DISPOSABLE) ×2 IMPLANT
TUBE CONNECTING 20X1/4 (TUBING) ×2 IMPLANT
WATER STERILE IRR 1000ML POUR (IV SOLUTION) IMPLANT
YANKAUER SUCT BULB TIP NO VENT (SUCTIONS) ×2 IMPLANT

## 2011-06-15 NOTE — Brief Op Note (Signed)
06/15/2011  4:36 PM  PATIENT:  Samantha Weeks, 71 y.o., female, MRN: [id number]  PREOP DIAGNOSIS:  Right breast cancer  POSTOP DIAGNOSIS:   Right breast cancer, T1, N0.  PROCEDURE:   Procedure(s): Right BREAST LUMPECTOMY WITH NEEDLE LOCALIZATION (excision of nipple/areola) AND right AXILLARY SENTINEL LYMPH NODE BX  (counts 3000, background 15, blue stained)  SURGEON:   Ovidio Kin, M.D.  ASSISTANT:   None  ANESTHESIA:   general  E. Jairo Ben, MD - Anesthesiologist Gar Gibbon, CRNA - CRNA  General  EBL:  <50  ml  BLOOD ADMINISTERED: none  DRAINS: none   LOCAL MEDICATIONS USED:   30 cc 1/4% marcaine  SPECIMEN:   Right axillary SLNode and right breast lumpectomy  COUNTS CORRECT:  YES  INDICATIONS FOR PROCEDURE:  Samantha Weeks is a 71 y.o. (DOB: [birth year]) white female whose primary care physician is No primary provider on file. and comes for right breast lumpectomy and right axillary SLNBx.   The indications and risks of the surgery were explained to the patient.  The risks include, but are not limited to, infection, bleeding, and nerve injury.  Note dictated to:   #[id number]

## 2011-06-15 NOTE — H&P (View-Only) (Signed)
 Re:   Samantha Weeks DOB:   09/22/1940 MRN:   [id number]  [facility]  ASSESSMENT AND PLAN: 1.  Right breast cancer, Central, behind the nipple. T1, N0.  Retracted nipple  1.5 cm on MRI  ER/PR - positive, Ki-67 - 10%  I discussed the options for breast cancer treatment with the patient.  I discussed the idea of a multidisciplinary approach to the treatment of breast cancer, which often includes medical oncology and radiation oncology.  I discussed the surgical options of lumpectomy vs. mastectomy.  I discussed the options of lymph node biopsy.    The risks of surgery include, but are not limited to, bleeding, infection, the need for further surgery, and nerve injury.  She wants to wait for her surgery until after she sees Bob Dylan.  She's seeing him in [place] and [place].  I discussed the SLNBx and its risks.  Seeing Drs. Kahn and Wentworth.  2.  Thyroid replacement.  REFERRING PHYSICIAN:  Dr. K. Hussain  HISTORY OF PRESENT ILLNESS: Samantha Weeks is a 71 y.o. (DOB: 04/13/1940)  white female whose primary care physician is Dr. K. Hussain and comes to [facility] today for right breast cancer.  She is accompanied by Betty Godwin (MRN: [id number], a breast cancer patient of mine).  She had maybe noticed a change in her right nipple.  She went for a mammogram which showed a sub areolar abnormality.  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    She has a sister, who lives in [place], who had breast cancer at age 53.  Her sister is 10 years younger than her.  She had a lumpectomy.  She had a hysterectomy in the 1980's for bleeding.  She took estrogen for about 7 years after that, but then stopped taking it.   Past Medical History  Diagnosis Date  . Breast cancer   . Hx of colonoscopy   . Hx of bone density study     Past Surgical History  Procedure Date  . Abdominal hysterectomy   . Cataract extraction   . Tonsillectomy     Current Outpatient Prescriptions  Medication Sig Dispense  Refill  . calcium citrate (CALCITRATE - DOSED IN MG ELEMENTAL CALCIUM) 950 MG tablet Take 1 tablet by mouth daily.      . calcium-vitamin D (OSCAL WITH D) 500-200 MG-UNIT per tablet Take 2 tablets by mouth daily.      . fish oil-omega-3 fatty acids 1000 MG capsule Take 1 g by mouth daily.      . levothyroxine (SYNTHROID, LEVOTHROID) 50 MCG tablet Take 50 mcg by mouth daily.      . Multiple Vitamin (MULTIVITAMIN) tablet Take 1 tablet by mouth daily.      . nortriptyline (PAMELOR) 50 MG capsule Take 100 mg by mouth daily.         No Known Allergies  REVIEW OF SYSTEMS: Skin:  No history of rash.  No history of abnormal moles. Infection:  No history of hepatitis or HIV.  No history of MRSA. Neurologic:  No history of stroke.  No history of seizure.  No history of headaches. Cardiac:  No history of hypertension. No history of heart disease.  No history of prior cardiac catheterization.  No history of seeing a cardiologist. Pulmonary:  Does not smoke cigarettes.  No asthma or bronchitis.  No OSA/CPAP.  Endocrine:  No diabetes. On thyroid replacement x 3 years. Gastrointestinal:  No history of stomach disease.  No history   of liver disease.  No history of gall bladder disease.  No history of pancreas disease.  No history of colon disease.  Colonoscopy in 2007.  Unknown gastroenterologist. Urologic:  No history of kidney stones.  No history of bladder infections. GYN:  Hysterectomy in 1980's for bleeding. Musculoskeletal:  No history of joint or back disease. Hematologic:  No bleeding disorder.  No history of anemia. Psycho-social:  The patient is oriented.   The patient has no obvious psychologic or social impairment to understanding our conversation and plan.  SOCIAL and FAMILY HISTORY: Divorced.  Lives by self. Betty Godwin, a friend and breast cancer patient of mine, accompanied her today. No children. 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  PHYSICAL EXAM: Wt Readings from Last 3  Encounters:  06/02/11 134 lb 3.2 oz (60.873 kg)   Temp Readings from Last 3 Encounters:  06/02/11 97.6 F (36.4 C)    BP Readings from Last 3 Encounters:  06/02/11 171/82   Pulse Readings from Last 3 Encounters:  06/02/11 89   General: WN WF who is alert and generally healthy appearing.  HEENT: Normal. Pupils equal. Good dentition. Neck: Supple. No mass.  No thyroid mass.  Carotid pulse okay with no bruit. Lymph Nodes:  No supraclavicular or cervical nodes. Lungs: Clear to auscultation and symmetric breath sounds. Heart:  RRR. No murmur or rub. Breasts:  Right:  Slightly inverted right nipple.  Questionable fullness under nipple, but I am not sure that I feel a mass.  I think she would benefit from a needle loc.  Left:  No mass or area of concern.  Abdomen: Soft. No mass. No tenderness. No hernia. Normal bowel sounds.  No abdominal scars. Rectal: Not done. Extremities:  Good strength and ROM  in upper and lower extremities. Neurologic:  Grossly intact to motor and sensory function. Psychiatric: Has normal mood and affect. Behavior is normal.   DATA REVIEWED: Mammograms, path reports.  Neftaly Swiss, MD,  FACS [facility] [address]  [address]   [place], [place]    [address] Phone:  [phone] FAX:  [phone]  

## 2011-06-15 NOTE — Anesthesia Postprocedure Evaluation (Signed)
  Anesthesia Post-op Note  Patient: Samantha Weeks  Procedure(s) Performed: Procedure(s) (LRB): BREAST LUMPECTOMY WITH NEEDLE LOCALIZATION AND AXILLARY SENTINEL LYMPH NODE BX (Right)  Patient Location: PACU  Anesthesia Type: General  Level of Consciousness: awake, alert  and oriented  Airway and Oxygen Therapy: Patient Spontanous Breathing  Post-op Pain: none  Post-op Assessment: Post-op Vital signs reviewed, Patient's Cardiovascular Status Stable, Respiratory Function Stable, Patent Airway, No signs of Nausea or vomiting and Pain level controlled  Post-op Vital Signs: Reviewed and stable  Complications: No apparent anesthesia complications

## 2011-06-15 NOTE — Interval H&P Note (Signed)
History and Physical Interval Note:  06/15/2011 3:02 PM  Samantha Weeks  has presented today for surgery, with the diagnosis of right breast cancer  The various methods of treatment have been discussed with the patient and family. Friend, Ava Herring, is with the patient.  After consideration of risks, benefits and other options for treatment, the patient has consented to  Procedure(s) (LRB): Right BREAST LUMPECTOMY WITH NEEDLE LOCALIZATION AND AXILLARY SENTINEL LYMPH NODE BX (Right) as a surgical intervention .  She understands that she will probably lose her right nipple in doing the lumpectomy.  The patients' history has been reviewed, patient examined, no change in status, stable for surgery.  I have reviewed the patients' chart and labs.  Questions were answered to the patient's satisfaction.     Domonique Cothran H

## 2011-06-15 NOTE — Op Note (Signed)
Samantha Weeks, Samantha Weeks NO.:  [id number]  MEDICAL RECORD NO.:  [id number]  LOCATION:  [facility]                         FACILITY:  [facility]  PHYSICIAN:  Sandria Bales. Ezzard Standing, M.D.  DATE OF BIRTH:  1940-08-26  DATE OF PROCEDURE:  06/15/2011                              OPERATIVE REPORT   PREOPERATIVE DIAGNOSIS:  Right breast cancer, T1, N0.  POSTOPERATIVE DIAGNOSIS:  Right breast cancer, T1, N0.  PROCEDURE:  Right breast lumpectomy (resection of nipple-areolar complex) and right axillary sentinel lymph node biopsy (counts 3000, background 15, blue-stained).  ASSISTANT:  No first assistant.  ANESTHESIA:  General LMA, supervised by Germaine Pomfret, M.D.  COMPLICATIONS:  None.  INDICATION FOR PROCEDURE:  [name] is a 71 year old white female, who sees Dr. Georgann Housekeeper, is a primary medical doctor.  [facility] with a new onset of right breast cancer.  I discussed with her about the surgical options.  We will plan to do a lumpectomy which will also involve removing her nipple-areolar complexes, tumor cyst right behind the right nipple and a right axillary sentinel lymph node biopsy.  Potential risks of surgery include, but are not limited to, bleeding, infection, need for further surgery, and nerve injury.  OPERATIVE NOTE:  The patient was taken to room #3 at [facility], underwent a general LMA anesthesia, supervised by Dr. Jairo Ben.  A time-out was held and surgical checklist run.  She had a wire placed at the 3 o'clock position of her right breast which went under the areola.  Then at the [facility], she was injected in the preop area with 1 mCi of technetium sulfa colloid.  I injected about 1 mL of 40% methylene blue.  A time-out was held, a surgical checklist run.  Her right breast was prepped with ChloraPrep and sterilely draped.  I first made a right axillary incision right at the junction of  the lateral margin of the pectoralis of the breast in the anterior to the pectoralis major muscle.  I found a lymph node, which was about 0.5 cm in size, it was blue stained, the counts were 3000 with background of about 15.  There was no hot areas either and along her internal mammary supraclavicular chain. This node was labeled and sent to Pathology.    Then, I turned my attention to the right breast cancer which was in the subareolar area, a little to the lateral edge of the nipple-areolar complex.  The tumor was close enough to the reality, nothing we could save it, though less than a 1-cm margin area between the areola and the tumor and so therefore, I excised the whole nipple-areolar complex.  I took out a block of breast tissue approximately 4 x 5 cm, the wire coming out at 3 o'clock position.  I painted the specimen with 6 color paint kit.  I did a specimen mammogram which confirms the clip in the wire in the middle of the specimen and this was then sent to Pathology.  The wounds were then irrigated with saline.  The subcutaneous tissue was infiltrated with 30 mL of 0.25% Marcaine.  I  marked the breast biopsy bed with 5 small clips one at 12 o'clock, 3 o'clock, 6 o'clock, and 9 o'clock and one at the bed of the biopsy cavity, and then closed in layers with 3-0 Vicryl sutures deep in 2 layers and the skin closed with 5-0 Monocryl suture.  The right axillary incision, I did the same thing, closed with a 3-0 Vicryl suture and the skin with a 5-0 Vicryl suture. The wounds were painted with Dermabond, sterilely dressed.    She was wrapped with one of the breast wraps, was transferred to recovery room in good condition. The sponge and needle count were correct.  Her final pathology is pending.  She will be discharged home tonight.   Sandria Bales. Ezzard Standing, M.D., FACS   DHN/MEDQ  D:  06/15/2011  T:  06/15/2011  Job:  [id number]  cc:   Georgann Housekeeper, MD Drue Second, M.D. Lurline Hare,  M.D.

## 2011-06-15 NOTE — Transfer of Care (Signed)
Immediate Anesthesia Transfer of Care Note  Patient: Samantha Weeks  Procedure(s) Performed: Procedure(s) (LRB): BREAST LUMPECTOMY WITH NEEDLE LOCALIZATION AND AXILLARY SENTINEL LYMPH NODE BX (Right)  Patient Location: PACU  Anesthesia Type: General  Level of Consciousness: awake and sedated  Airway & Oxygen Therapy: Patient Spontanous Breathing and Patient connected to face mask oxygen  Post-op Assessment: Report given to PACU RN and Post -op Vital signs reviewed and stable  Post vital signs: Reviewed and stable  Complications: No apparent anesthesia complications

## 2011-06-15 NOTE — Discharge Instructions (Signed)
[facility] - DISCHARGE INSTRUCTIONS TO PATIENT  Return to work on:  Next week  Activity:  Driving - May drive starting tomorrow if doing well   Lifting - No limit  Wound Care:   Leave wound dry for 2 days.  Then may remove dressing and shower.  Diet:  As tolerated.  Follow up appointment:  Call Dr. Allene Pyo office [facility]) at [phone] for an appointment in 1 week.  Medications and dosages:  Resume your home medications.  You have a prescription for:  Vicodin.  Call Dr. Ezzard Standing or his office  [phone]) if you have:  Temperature greater than 100.4,  Persistent nausea and vomiting,  Severe uncontrolled pain,  Redness, tenderness, or signs of infection (pain, swelling, redness, odor or green/yellow discharge around the site),  Difficulty breathing, headache or visual disturbances,  Any other questions or concerns you may have after discharge.  In an emergency, call 911 or go to an Emergency Department at a nearby hospital.   Post Anesthesia Home Care Instructions  Activity: Get plenty of rest for the remainder of the day. A responsible adult should stay with you for 24 hours following the procedure.  For the next 24 hours, DO NOT: -Drive a car -Advertising copywriter -Drink alcoholic beverages -Take any medication unless instructed by your physician -Make any legal decisions or sign important papers.  Meals: Start with liquid foods such as gelatin or soup. Progress to regular foods as tolerated. Avoid greasy, spicy, heavy foods. If nausea and/or vomiting occur, drink only clear liquids until the nausea and/or vomiting subsides. Call your physician if vomiting continues.  Special Instructions/Symptoms: Your throat may feel dry or sore from the anesthesia or the breathing tube placed in your throat during surgery. If this causes discomfort, gargle with warm salt water. The discomfort should disappear within 24 hours.   cws

## 2011-06-15 NOTE — Anesthesia Preprocedure Evaluation (Signed)
Anesthesia Evaluation  Patient identified by MRN, date of birth, ID band Patient awake    Reviewed: Allergy & Precautions, H&P , NPO status , Patient's Chart, lab work & pertinent test results  History of Anesthesia Complications Negative for: history of anesthetic complications  Airway Mallampati: II TM Distance: >3 FB Neck ROM: Full    Dental No notable dental hx. (+) Teeth Intact and Dental Advisory Given   Pulmonary former smoker (quit 20 years) breath sounds clear to auscultation  Pulmonary exam normal       Cardiovascular negative cardio ROS  Rhythm:Regular Rate:Normal     Neuro/Psych negative neurological ROS     GI/Hepatic negative GI ROS, Neg liver ROS,   Endo/Other  Hypothyroidism (on replacement)   Renal/GU negative Renal ROS     Musculoskeletal   Abdominal   Peds  Hematology negative hematology ROS (+)   Anesthesia Other Findings   Reproductive/Obstetrics                           Anesthesia Physical Anesthesia Plan  ASA: II  Anesthesia Plan: General   Post-op Pain Management:    Induction: Intravenous  Airway Management Planned: LMA  Additional Equipment:   Intra-op Plan:   Post-operative Plan:   Informed Consent: I have reviewed the patients History and Physical, chart, labs and discussed the procedure including the risks, benefits and alternatives for the proposed anesthesia with the patient or authorized representative who has indicated his/her understanding and acceptance.   Dental advisory given  Plan Discussed with: Surgeon and CRNA  Anesthesia Plan Comments: (Plan routine monitors, GA- LMA OK)        Anesthesia Quick Evaluation

## 2011-06-15 NOTE — Anesthesia Procedure Notes (Signed)
Procedure Name: LMA Insertion Date/Time: 06/15/2011 3:30 PM Performed by: Gar Gibbon Pre-anesthesia Checklist: Patient identified, Emergency Drugs available, Suction available and Patient being monitored Patient Re-evaluated:Patient Re-evaluated prior to inductionOxygen Delivery Method: Circle System Utilized Preoxygenation: Pre-oxygenation with 100% oxygen Intubation Type: IV induction Ventilation: Mask ventilation without difficulty LMA: LMA inserted LMA Size: 4.0 Number of attempts: 1 Airway Equipment and Method: bite block Placement Confirmation: positive ETCO2 Tube secured with: Tape Dental Injury: Teeth and Oropharynx as per pre-operative assessment

## 2011-06-17 ENCOUNTER — Telehealth (INDEPENDENT_AMBULATORY_CARE_PROVIDER_SITE_OTHER): Payer: Self-pay

## 2011-06-17 NOTE — Telephone Encounter (Signed)
I called the pt to give her an appt for next week.  She inquired about her path report.  It was in so I informed her the lymph node was negative and the margins are clear.  We will give her a copy next week. She will call with any concerns about her incision.

## 2011-06-24 ENCOUNTER — Encounter (INDEPENDENT_AMBULATORY_CARE_PROVIDER_SITE_OTHER): Payer: Self-pay | Admitting: Surgery

## 2011-06-24 ENCOUNTER — Ambulatory Visit (INDEPENDENT_AMBULATORY_CARE_PROVIDER_SITE_OTHER): Payer: Medicare Other | Admitting: Surgery

## 2011-06-24 VITALS — BP 126/64 | HR 80 | Temp 98.7°F | Resp 14 | Ht 62.0 in | Wt 134.4 lb

## 2011-06-24 DIAGNOSIS — C50119 Malignant neoplasm of central portion of unspecified female breast: Secondary | ICD-10-CM

## 2011-06-24 NOTE — Progress Notes (Signed)
Re:   Samantha Weeks DOB:   [birth year] MRN:   [id number]  [facility]  ASSESSMENT AND PLAN: 1.  Right breast cancer, Central, behind the nipple. T1c, N0.  Central lumpectomy - 06/15/2011  1.3 cm IDC.  0/1 node  ER/PR - 100/100, Ki-67 - 12%, Her2Neu - neg.  Seeing Drs. Park Breed and Michell Heinrich.  She is doing well.  I will see her back in 6 months.  2.  Thyroid replacement.  REFERRING PHYSICIAN:  Dr. Burney Gauze  HISTORY OF PRESENT ILLNESS: Samantha Weeks is a 71 y.o. (DOB: [birth year])  white female whose primary care physician is Dr. Burney Gauze and comes to [facility] today for right breast cancer.  She comes back from a right breast central lumpectomy and right axillary SLNBx on 06/15/2011.  She has done well.  I gave her a copy of her path report. She is to see Dr. Michell Heinrich in the next week for discussion of Rad Tx.   Past Medical History  Diagnosis Date  . Hx of colonoscopy   . Hx of bone density study   . Depression   . Breast cancer     RT BREAST  . Hypothyroidism     Past Surgical History  Procedure Date  . Abdominal hysterectomy   . Cataract extraction   . Tonsillectomy   . Eye surgery     BIL CATARACT AND RT RETINAL SX  . Breast surgery 06/15/2011    Right Br lumpectomy    Current Outpatient Prescriptions  Medication Sig Dispense Refill  . calcium citrate (CALCITRATE - DOSED IN MG ELEMENTAL CALCIUM) 950 MG tablet Take 1 tablet by mouth daily.      . calcium-vitamin D (OSCAL WITH D) 500-200 MG-UNIT per tablet Take 2 tablets by mouth daily.      . fish oil-omega-3 fatty acids 1000 MG capsule Take 1 g by mouth daily.      Marland Kitchen HYDROcodone-acetaminophen (NORCO) 5-325 MG per tablet Take 1-2 tablets by mouth every 6 (six) hours as needed for pain.  30 tablet  1  . levothyroxine (SYNTHROID, LEVOTHROID) 50 MCG tablet Take by mouth daily.       . Multiple Vitamin (MULTIVITAMIN) tablet Take 1 tablet by mouth daily.      . nortriptyline (PAMELOR) 50 MG capsule Take 100 mg by mouth daily.         No  Known Allergies  REVIEW OF SYSTEMS: Endocrine:  No diabetes. On thyroid replacement x 3 years. Gastrointestinal:  No history of stomach disease.  No history of liver disease.  No history of gall bladder disease.  No history of pancreas disease.  No history of colon disease.  Colonoscopy in 2007.  Unknown gastroenterologist. Urologic:  No history of kidney stones.  No history of bladder infections. GYN:  Hysterectomy in 1980's for bleeding.  SOCIAL and FAMILY HISTORY: Divorced.  Lives by self. [name], a friend and breast cancer patient of mine, accompanied her today. No children. 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  PHYSICAL EXAM: BP 126/64  Pulse 80  Temp(Src) 98.7 F (37.1 C) (Temporal)  Resp 14  Ht 5\' 2"  (1.575 m)  Wt 134 lb 6.4 oz (60.963 kg)  BMI 24.58 kg/m2  General: WN WF who is alert and generally healthy appearing.  Breasts:  Right:  Incision healing with minimal redness.  Wound okay with dermabond still on skin.  Left:  No mass or area of concern.  DATA REVIEWED: Path report to  patient.  Ovidio Kin, MD,  [facility] [address] [place]  [address]   [place], [place]    [address] Phone:  [phone] FAX:  [phone]

## 2011-06-28 ENCOUNTER — Encounter: Payer: Self-pay | Admitting: Radiation Oncology

## 2011-06-28 NOTE — Progress Notes (Signed)
71 year old female. Single. Works as a Psychologist, forensic.  Menarche age 39. Menopause 1993. Took hormone replacement therapy for about 6-7 years but, has been off for 15-20 years. Had first child age 28.   Mammogram on 05/27/2011 showed a small spiculated mass in the right breast in the subareolar portion with nipple retraction. Biopsy showed a grad 1 invasive ductal carcinoma, ER, PR positive and HER 2 negative. MRI showed a solitary lesion in the right breast measuring 1.5 cm. Seen in [facility] by Dr. Michell Heinrich on 06/02/2011.  Right breast central lumpectomy and right axillary SLNBx on 06/15/2011 confirmed invasive ductal carcinoma.   Hyperfractionated radiation discussed in [facility] followed by antiestrogen therapy with Dr. Welton Flakes.   PCP Dr. Burney Gauze NKDA No indication of a pacemaker No hx of radiation therapy

## 2011-06-30 ENCOUNTER — Ambulatory Visit
Admission: RE | Admit: 2011-06-30 | Discharge: 2011-06-30 | Disposition: A | Discharge status: Home / Self Care | Payer: Medicare Other | Source: Ambulatory Visit | Attending: Radiation Oncology | Admitting: Radiation Oncology

## 2011-06-30 ENCOUNTER — Encounter: Payer: Self-pay | Admitting: Radiation Oncology

## 2011-06-30 VITALS — BP 141/82 | HR 89 | Temp 97.8°F | Resp 18 | Ht 61.0 in | Wt 134.6 lb

## 2011-06-30 DIAGNOSIS — C50119 Malignant neoplasm of central portion of unspecified female breast: Secondary | ICD-10-CM

## 2011-06-30 DIAGNOSIS — Z510 Encounter for antineoplastic radiation therapy: Secondary | ICD-10-CM | POA: Insufficient documentation

## 2011-06-30 DIAGNOSIS — L589 Radiodermatitis, unspecified: Secondary | ICD-10-CM | POA: Insufficient documentation

## 2011-06-30 DIAGNOSIS — Z79899 Other long term (current) drug therapy: Secondary | ICD-10-CM | POA: Insufficient documentation

## 2011-06-30 DIAGNOSIS — Y842 Radiological procedure and radiotherapy as the cause of abnormal reaction of the patient, or of later complication, without mention of misadventure at the time of the procedure: Secondary | ICD-10-CM | POA: Insufficient documentation

## 2011-06-30 DIAGNOSIS — C50919 Malignant neoplasm of unspecified site of unspecified female breast: Secondary | ICD-10-CM | POA: Insufficient documentation

## 2011-06-30 DIAGNOSIS — Z7982 Long term (current) use of aspirin: Secondary | ICD-10-CM | POA: Insufficient documentation

## 2011-06-30 DIAGNOSIS — Z170 Estrogen receptor positive status [ER+]: Secondary | ICD-10-CM | POA: Diagnosis not present

## 2011-06-30 NOTE — Progress Notes (Signed)
Please see progress note under physician encounter 

## 2011-06-30 NOTE — Progress Notes (Signed)
Complete PATIENT MEASURE OF DISTRESS with a score of 2 submitted to social work. Also, complete NUTRITION RISK SCREEN worksheet without concerns submitted to Zenovia Jarred, RD.

## 2011-06-30 NOTE — Progress Notes (Signed)
Radiation Oncology         [phone] ________________________________  Name: Samantha Weeks MRN: [id number]  Date: 06/30/2011  DOB: 24-Jun-1940  Follow-Up Visit Note  CC: Georgann Housekeeper, MD, MD  Kandis Cocking, MD  Diagnosis:   T1 N0 right breast cancer  Narrative:  The patient returns today for routine follow-up.  She had her surgery on May 7. She had a 1.3 cm tumor which was removed. The closest margin was 0.9 cm. 0 out of one lymph nodes were positive. The tumor was ER and PR positive at 100% and HER-2 was negative. She is interested in proceeding on with radiation. She has an appointment with Dr. Welton Flakes on Friday and is very reluctant to undergo antiestrogen therapy. She is done her research and fact has a paper from M.D. Dareen Piano outlining the benefit of radiation in elderly patients (a SEER analysis). She has no complaints today.                              ALLERGIES:   has no known allergies.  Meds: Current Outpatient Prescriptions  Medication Sig Dispense Refill  . aspirin 81 MG tablet Take 81 mg by mouth daily.      . calcium citrate (CALCITRATE - DOSED IN MG ELEMENTAL CALCIUM) 950 MG tablet Take 1 tablet by mouth daily.      . calcium-vitamin D (OSCAL WITH D) 500-200 MG-UNIT per tablet Take 2 tablets by mouth daily.      . fish oil-omega-3 fatty acids 1000 MG capsule Take 1 g by mouth daily.      Marland Kitchen levothyroxine (SYNTHROID, LEVOTHROID) 50 MCG tablet Take by mouth daily.       . Multiple Vitamin (MULTIVITAMIN) tablet Take 1 tablet by mouth daily.      . nortriptyline (PAMELOR) 50 MG capsule Take 100 mg by mouth daily.        Physical Findings: The patient is in no acute distress. Patient is alert and oriented. . her incision is healing well.  height is 5\' 1"  (1.549 m) and weight is 134 lb 9.6 oz (61.054 kg). Her oral temperature is 97.8 F (36.6 C). Her blood pressure is 141/82 and her pulse is 89. Her respiration is 18 and oxygen saturation is 100%. .  No significant  changes.  Lab Findings: Lab Results  Component Value Date   WBC 5.4 06/02/2011   HGB 13.3 06/02/2011   HCT 40.2 06/02/2011   MCV 86.1 06/02/2011   PLT 311 06/02/2011      Radiographic Findings: Mr Breast Bilateral W Wo Contrast  06/01/2011  *RADIOLOGY REPORT*  Clinical Data: New diagnosis right-sided breast cancer  BILATERAL BREAST MRI WITH AND WITHOUT CONTRAST  Technique: Multiplanar, multisequence MR images of both breasts were obtained prior to and following the intravenous administration of 12ml of Multihance.  Three dimensional images were evaluated at the independent DynaCad workstation.  Comparison:  Most recent mammogram dated 05/05/2011  Findings: A round, homogeneously enhancing mass with plateau kinetics and irregular margins is imaged in the right subareolar region with associated retraction of the right nipple measuring 1.5 x 1.1 x 1.2 cm.  A biopsy clip is seen in association with the mass.  No additional mass or suspicious enhancement seen in either breast.  No axillary or internal mammary adenopathy is identified.  IMPRESSION: Known malignancy, right breast as detailed above.  No MRI specific evidence of malignancy, left breast.  THREE-DIMENSIONAL MR  IMAGE RENDERING ON INDEPENDENT WORKSTATION:  Three-dimensional MR images were rendered by post-processing of the original MR data on an independent workstation.  The three- dimensional MR images were interpreted, and findings were reported in the accompanying complete MRI report for this study.  BI-RADS CATEGORY 6:  Known biopsy-proven malignancy - appropriate action should be taken.  Original Report Authenticated By: Hiram Gash, M.D.   Nm Sentinel Node Inj-no Rpt (breast)  06/15/2011  CLINICAL DATA: right breast cancer.   Sulfur colloid was injected intradermally by the nuclear medicine  technologist for breast cancer sentinel node localization.     [place] Wire Localization Right  06/15/2011  *RADIOLOGY REPORT*  Clinical Data:  Right  breast cancer  RIGHT BREAST NEEDLE LOCALIZATION USING ULTRASOUND GUIDANCE AND SPECIMEN RADIOGRAPH  Patient presents for needle localization prior to surgical excision. The patient and I discussed the procedure of needle localization including benefits and alternatives. We discussed the high likelihood of a successful procedure. We discussed the risks of the procedure, including infection, bleeding, tissue injury and further surgery. Informed written consent was given.  Using ultrasound guidance, sterile technique, 2% lidocaine and a a 7 cm InRad Ultrawire, the mass in the right subareolar region was localized using a mediolateral approach. Films were labeled and sent with the patient to surgery.  She tolerated the procedure well.  Specimen radiograph is performed at [facility] and confirms the clip, wire and mass to be present in the tissue sample.  The specimen is marked for pathology.  IMPRESSION: Needle localization right breast.  No apparent complications.  Original Report Authenticated By: Daryl Eastern, M.D.   Mm Breast Surgical Specimen  06/15/2011  *RADIOLOGY REPORT*  Clinical Data:  Right breast cancer  RIGHT BREAST NEEDLE LOCALIZATION USING ULTRASOUND GUIDANCE AND SPECIMEN RADIOGRAPH  Patient presents for needle localization prior to surgical excision. The patient and I discussed the procedure of needle localization including benefits and alternatives. We discussed the high likelihood of a successful procedure. We discussed the risks of the procedure, including infection, bleeding, tissue injury and further surgery. Informed written consent was given.  Using ultrasound guidance, sterile technique, 2% lidocaine and a a 7 cm InRad Ultrawire, the mass in the right subareolar region was localized using a mediolateral approach. Films were labeled and sent with the patient to surgery.  She tolerated the procedure well.  Specimen radiograph is performed at [facility]  and confirms the clip, wire and mass to be present in the tissue sample.  The specimen is marked for pathology.  IMPRESSION: Needle localization right breast.  No apparent complications.  Original Report Authenticated By: Daryl Eastern, M.D.    Impression: T1 N0 right breast cancer status post lumpectomy healing well  Plan:  I talked to [place] today. She is interested in proceeding on with radiation for local control. She understands the randomized trial showing no survival benefit to patients who receive radiation plus antiestrogen plus surgery. She would like to proceed forward with radiation treatment. I think it would be safe to treat her with hyperfractionated radiation. I scheduled her simulation for Friday. We've gone over her consent form in great detail. I will be out of the office on Friday but will have one of my partners cover her simulation. We can go ahead and get started next week. She would like to continue to work during her treatments. Me to Dr. Welton Flakes on Friday.  _____________________________________

## 2011-06-30 NOTE — Progress Notes (Signed)
Patient presents to the clinic today unaccompanied for a consultation with Dr. Michell Heinrich reference right breast ca. Patient is alert and oriented to person, place, and time. No distress noted. Steady gait noted. Pleasant affect noted. Patient denies pain at this time. More importantly patient denies breast pain. Patient reports her incisions have closed since surgery and she does feel or see any redness, warmth or edema. Patient denies nausea, vomiting, headache, dizziness or diarrhea. Patient has no complaints at this time. Patient reports eating and sleeping without difficulty. Patient scheduled to follow up with Dr. Welton Flakes on Friday. Provided patient with this writer's business card and encourage to call with needs. Patient verbalized understanding. Reported all finding to Dr. Michell Heinrich.

## 2011-07-01 ENCOUNTER — Ambulatory Visit: Payer: Medicare Other | Admitting: Oncology

## 2011-07-01 NOTE — Progress Notes (Signed)
Ok. thx KK

## 2011-07-02 ENCOUNTER — Encounter: Payer: Self-pay | Admitting: Oncology

## 2011-07-02 ENCOUNTER — Encounter: Payer: Self-pay | Admitting: Radiation Oncology

## 2011-07-02 ENCOUNTER — Ambulatory Visit
Admission: RE | Admit: 2011-07-02 | Discharge: 2011-07-02 | Disposition: A | Discharge status: Home / Self Care | Payer: Medicare Other | Source: Ambulatory Visit | Attending: Radiation Oncology | Admitting: Radiation Oncology

## 2011-07-02 ENCOUNTER — Telehealth: Payer: Self-pay | Admitting: *Deleted

## 2011-07-02 ENCOUNTER — Ambulatory Visit (HOSPITAL_BASED_OUTPATIENT_CLINIC_OR_DEPARTMENT_OTHER): Payer: Medicare Other | Admitting: Oncology

## 2011-07-02 VITALS — BP 167/94 | HR 87 | Temp 97.8°F | Ht 61.0 in | Wt 134.9 lb

## 2011-07-02 DIAGNOSIS — C50119 Malignant neoplasm of central portion of unspecified female breast: Secondary | ICD-10-CM

## 2011-07-02 DIAGNOSIS — Z7982 Long term (current) use of aspirin: Secondary | ICD-10-CM | POA: Diagnosis not present

## 2011-07-02 DIAGNOSIS — Z79899 Other long term (current) drug therapy: Secondary | ICD-10-CM | POA: Diagnosis not present

## 2011-07-02 DIAGNOSIS — C50919 Malignant neoplasm of unspecified site of unspecified female breast: Secondary | ICD-10-CM | POA: Diagnosis not present

## 2011-07-02 DIAGNOSIS — Z170 Estrogen receptor positive status [ER+]: Secondary | ICD-10-CM | POA: Diagnosis not present

## 2011-07-02 DIAGNOSIS — Z510 Encounter for antineoplastic radiation therapy: Secondary | ICD-10-CM | POA: Diagnosis not present

## 2011-07-02 DIAGNOSIS — L589 Radiodermatitis, unspecified: Secondary | ICD-10-CM | POA: Diagnosis not present

## 2011-07-02 NOTE — Progress Notes (Signed)
Simulation note  Samantha Weeks will receive right whole breast radiotherapy. The patient was laid in the supine position on the treatment table with her arms over her head. I placed adhesive wiring over her lumpectomy scar and around the borders of her right breast tissue. High-resolution CT axial imaging was obtained of the patient's chest. An isocenter was placed in her anterior right lung. Skin markings were made and she tolerated the procedure well without any complications.  Treatment planning will be conducted by Dr. Michell Heinrich.

## 2011-07-02 NOTE — Progress Notes (Signed)
Encounter addended by: Agnes Lawrence, RN on: 07/02/2011  4:15 PM<BR>     Documentation filed: Inpatient Patient Education, Charges VN

## 2011-07-02 NOTE — Telephone Encounter (Signed)
gave patient appointment for 10-2011 printed out calendar and gave to the patient 

## 2011-07-02 NOTE — Patient Instructions (Signed)
1. You decided against anti-estrogen therapy after going over adjuvantonline.  2. I will see you back in September for follow up

## 2011-07-02 NOTE — Progress Notes (Signed)
OFFICE PROGRESS NOTE  CC  Georgann Housekeeper, MD, MD [address] [place] [address] Dr. Ovidio Kin Dr. Lurline Hare   DIAGNOSIS: 71 year old with Stage I IDC s/p central lumpectomy on 06/15/11  PRIOR THERAPY: 1. S/P central lumpectomy for a 1.3 cm IDC, ER+ 0/1 lymph node positive for mets   CURRENT THERAPY:proceed with radiation therapy  INTERVAL HISTORY: Samantha Weeks 71 y.o. female returns for follow up visit post surgery. Overall she is doing well. No compalints are presented, no fevers or chills or nausea or vomiting, some pain at the lumpectomy site pain.   MEDICAL HISTORY: Past Medical History  Diagnosis Date  . Hx of colonoscopy   . Hx of bone density study   . Depression   . Breast cancer     RT BREAST  . Hypothyroidism     ALLERGIES:   has no known allergies.  MEDICATIONS:  Current Outpatient Prescriptions  Medication Sig Dispense Refill  . aspirin 81 MG tablet Take 81 mg by mouth daily.      . calcium citrate (CALCITRATE - DOSED IN MG ELEMENTAL CALCIUM) 950 MG tablet Take 1 tablet by mouth daily.      . calcium-vitamin D (OSCAL WITH D) 500-200 MG-UNIT per tablet Take 2 tablets by mouth daily.      . fish oil-omega-3 fatty acids 1000 MG capsule Take 1 g by mouth daily.      Marland Kitchen levothyroxine (SYNTHROID, LEVOTHROID) 50 MCG tablet Take by mouth daily.       . Multiple Vitamin (MULTIVITAMIN) tablet Take 1 tablet by mouth daily.      . nortriptyline (PAMELOR) 50 MG capsule Take 100 mg by mouth daily.        SURGICAL HISTORY:  Past Surgical History  Procedure Date  . Cataract extraction   . Tonsillectomy   . Eye surgery     BIL CATARACT AND RT RETINAL SX  . Breast surgery 06/15/2011    Right Br lumpectomy  . Abdominal hysterectomy     Complete    REVIEW OF SYSTEMS:  A comprehensive review of systems was negative.   PHYSICAL EXAMINATION: General appearance: alert, cooperative and appears stated age Resp: clear to auscultation  bilaterally and normal percussion bilaterally Back: symmetric, no curvature. ROM normal. No CVA tenderness. Cardio: regular rate and rhythm, S1, S2 normal, no murmur, click, rub or gallop GI: soft, non-tender; bowel sounds normal; no masses,  no organomegaly Extremities: extremities normal, atraumatic, no cyanosis or edema Neurologic: Grossly normal  ECOG PERFORMANCE STATUS: 0 - Asymptomatic  Blood pressure 167/94, pulse 87, temperature 97.8 F (36.6 C), height 5\' 1"  (1.549 m), weight 134 lb 14.4 oz (61.19 kg).  LABORATORY DATA: Lab Results  Component Value Date   WBC 5.4 06/02/2011   HGB 13.3 06/02/2011   HCT 40.2 06/02/2011   MCV 86.1 06/02/2011   PLT 311 06/02/2011      Chemistry      Component Value Date/Time   NA 141 06/02/2011 0814   K 3.7 06/02/2011 0814   CL 103 06/02/2011 0814   CO2 29 06/02/2011 0814   BUN 17 06/02/2011 0814   CREATININE 0.89 06/02/2011 0814      Component Value Date/Time   CALCIUM 9.5 06/02/2011 0814   ALKPHOS 79 06/02/2011 0814   AST 25 06/02/2011 0814   ALT 20 06/02/2011 0814   BILITOT 0.3 06/02/2011 0814       RADIOGRAPHIC STUDIES:  Nm Sentinel Node Inj-no Rpt (  breast)  06/15/2011  CLINICAL DATA: right breast cancer.   Sulfur colloid was injected intradermally by the nuclear medicine  technologist for breast cancer sentinel node localization.     [place] Wire Localization Right  06/15/2011  *RADIOLOGY REPORT*  Clinical Data:  Right breast cancer  RIGHT BREAST NEEDLE LOCALIZATION USING ULTRASOUND GUIDANCE AND SPECIMEN RADIOGRAPH  Patient presents for needle localization prior to surgical excision. The patient and I discussed the procedure of needle localization including benefits and alternatives. We discussed the high likelihood of a successful procedure. We discussed the risks of the procedure, including infection, bleeding, tissue injury and further surgery. Informed written consent was given.  Using ultrasound guidance, sterile technique, 2% lidocaine and a  a 7 cm InRad Ultrawire, the mass in the right subareolar region was localized using a mediolateral approach. Films were labeled and sent with the patient to surgery.  She tolerated the procedure well.  Specimen radiograph is performed at [facility] and confirms the clip, wire and mass to be present in the tissue sample.  The specimen is marked for pathology.  IMPRESSION: Needle localization right breast.  No apparent complications.  Original Report Authenticated By: Daryl Eastern, M.D.   Mm Breast Surgical Specimen  06/15/2011  *RADIOLOGY REPORT*  Clinical Data:  Right breast cancer  RIGHT BREAST NEEDLE LOCALIZATION USING ULTRASOUND GUIDANCE AND SPECIMEN RADIOGRAPH  Patient presents for needle localization prior to surgical excision. The patient and I discussed the procedure of needle localization including benefits and alternatives. We discussed the high likelihood of a successful procedure. We discussed the risks of the procedure, including infection, bleeding, tissue injury and further surgery. Informed written consent was given.  Using ultrasound guidance, sterile technique, 2% lidocaine and a a 7 cm InRad Ultrawire, the mass in the right subareolar region was localized using a mediolateral approach. Films were labeled and sent with the patient to surgery.  She tolerated the procedure well.  Specimen radiograph is performed at [facility] and confirms the clip, wire and mass to be present in the tissue sample.  The specimen is marked for pathology.  IMPRESSION: Needle localization right breast.  No apparent complications.  Original Report Authenticated By: Daryl Eastern, M.D.   PATHOLOGY  ADDITIONAL INFORMATION: 2. CHROMOGENIC IN-SITU HYBRIDIZATION Interpretation HER-2/NEU BY CISH - NO AMPLIFICATION OF HER-2 DETECTED. THE RATIO OF HER-2: CEP 17 SIGNALS WAS 1.19. Reference range: Ratio: HER2:CEP17 < 1.8 - gene amplification not observed Ratio: HER2:CEP  17 1.8-2.2 - equivocal result Ratio: HER2:CEP17 > 2.2 - gene amplification observed Abigail Miyamoto MD Pathologist, Electronic Signature ( Signed 06/22/2011) FINAL DIAGNOSIS Diagnosis 1. Lymph node, sentinel, biopsy, Right axilla - ONE BENIGN LYMPH NODE (0/1). 2. Breast, lumpectomy, Right - INVASIVE DUCTAL CARCINOMA, 1.3 CM. - MSBR GRADE I. - MARGINS NOT INVOLVED. Microscopic Comment 2. BREAST, INVASIVE TUMOR, WITH LYMPH NODE SAMPLING Specimen, including laterality: Right breast Procedure: Needle localized lumpectomy Grade: I 1 of 3 FINAL for Samantha Weeks, Samantha Weeks ([id number]) Microscopic Comment(continued) Tubule formation: 1 Nuclear pleomorphism: 2 Mitotic: 1 Tumor size (gross measurement): 1.3 cm Margins: Free of tumor Invasive, distance to closest margin: 0.9 cm from anterior resection margin In-situ, distance to closest margin: N/A If margin positive, focally or broadly: N/A Lymphovascular invasion: Not identified Ductal carcinoma in situ: No Grade: N/A Extensive intraductal component: N/A Lobular neoplasia: No Treatment effect: No If present, treatment effect in breast tissue, lymph nodes or both: N/A Extent of tumor: Skin: Free of tumor Nipple:  Tumor extends into the subareolar connective tissue Skeletal muscle: N/A Lymph nodes: # examined: 1 Lymph nodes with metastasis: 0 Isolated tumor cells (< 0.2 mm): 0 Micrometastasis: (> 0.2 mm and < 2.0 mm): 0 Macrometastasis: (> 2.0 mm): 0 Extracapsular extension: N/A Breast prognostic profile: Case [id number] Estrogen receptor: 100%, positive Progesterone receptor: 100%, positive Her 2 neu: No amplification; ratio is 1.14; will be repeated on the current specimen. Ki-67: 12% Non-neoplastic breast: Fibrocystic changes TNM: pT1c, pN0, pMX (JDP:kh 06-17-11) Jimmy Picket MD Pathologist, Electronic Signature (Case signed 06/17/2011) Specimen Gross and Clinical I ASSESSMENT: 71 year old female with stage I invasive ductal  carcinoma, ER and PR positive Her2Neu negative. S/P lumpectomy with sentinel lymph node biopsy.    PLAN:  1. Refer to radiation   2. She will begin AI adjuvantly after completion of radiation therapy   All questions were answered. The patient knows to call the clinic with any problems, questions or concerns. We can certainly see the patient much sooner if necessary.  I spent 20 minutes counseling the patient face to face. The total time spent in the appointment was 30 minutes.    Drue Second, MD Medical/Oncology [facility] [phone] (beeper) [phone] (Office)  07/15/2011, 10:29 PM

## 2011-07-07 DIAGNOSIS — C50919 Malignant neoplasm of unspecified site of unspecified female breast: Secondary | ICD-10-CM | POA: Diagnosis not present

## 2011-07-07 DIAGNOSIS — Z79899 Other long term (current) drug therapy: Secondary | ICD-10-CM | POA: Diagnosis not present

## 2011-07-07 DIAGNOSIS — Z170 Estrogen receptor positive status [ER+]: Secondary | ICD-10-CM | POA: Diagnosis not present

## 2011-07-07 DIAGNOSIS — C50119 Malignant neoplasm of central portion of unspecified female breast: Secondary | ICD-10-CM | POA: Diagnosis not present

## 2011-07-07 DIAGNOSIS — L589 Radiodermatitis, unspecified: Secondary | ICD-10-CM | POA: Diagnosis not present

## 2011-07-07 DIAGNOSIS — Z510 Encounter for antineoplastic radiation therapy: Secondary | ICD-10-CM | POA: Diagnosis not present

## 2011-07-07 DIAGNOSIS — Z7982 Long term (current) use of aspirin: Secondary | ICD-10-CM | POA: Diagnosis not present

## 2011-07-12 ENCOUNTER — Ambulatory Visit: Admission: RE | Admit: 2011-07-12 | Payer: Medicare Other | Source: Ambulatory Visit

## 2011-07-13 ENCOUNTER — Ambulatory Visit: Payer: Medicare Other

## 2011-07-13 ENCOUNTER — Ambulatory Visit
Admission: RE | Admit: 2011-07-13 | Discharge: 2011-07-13 | Disposition: A | Discharge status: Home / Self Care | Payer: Medicare Other | Source: Ambulatory Visit | Attending: Radiation Oncology | Admitting: Radiation Oncology

## 2011-07-13 DIAGNOSIS — Z170 Estrogen receptor positive status [ER+]: Secondary | ICD-10-CM | POA: Diagnosis not present

## 2011-07-13 DIAGNOSIS — C50119 Malignant neoplasm of central portion of unspecified female breast: Secondary | ICD-10-CM

## 2011-07-13 DIAGNOSIS — L589 Radiodermatitis, unspecified: Secondary | ICD-10-CM | POA: Diagnosis not present

## 2011-07-13 DIAGNOSIS — Z79899 Other long term (current) drug therapy: Secondary | ICD-10-CM | POA: Diagnosis not present

## 2011-07-13 DIAGNOSIS — Z510 Encounter for antineoplastic radiation therapy: Secondary | ICD-10-CM | POA: Diagnosis not present

## 2011-07-13 DIAGNOSIS — C50919 Malignant neoplasm of unspecified site of unspecified female breast: Secondary | ICD-10-CM | POA: Diagnosis not present

## 2011-07-13 DIAGNOSIS — Z7982 Long term (current) use of aspirin: Secondary | ICD-10-CM | POA: Diagnosis not present

## 2011-07-13 NOTE — Progress Notes (Signed)
  Radiation Oncology         [phone] ________________________________  Name: Samantha Weeks FIRST MRN: [id number]  Date: 07/13/2011  DOB: 1940/09/09  Simulation Verification Note  Status: outpatient  NARRATIVE: The patient was brought to the treatment unit and placed in the planned treatment position. The clinical setup was verified. Then port films were obtained and uploaded to the radiation oncology medical record software.  The treatment beams were carefully compared against the planned radiation fields. The position location and shape of the radiation fields was reviewed. The targeted volume of tissue appears appropriately covered by the radiation beams. Organs at risk appear to be excluded as planned.  Based on my personal review, I approved the simulation verification. The patient's treatment will proceed as planned.  ------------------------------------------------  Lurline Hare, MD

## 2011-07-14 ENCOUNTER — Ambulatory Visit
Admission: RE | Admit: 2011-07-14 | Discharge: 2011-07-14 | Disposition: A | Discharge status: Home / Self Care | Payer: Medicare Other | Source: Ambulatory Visit | Attending: Radiation Oncology | Admitting: Radiation Oncology

## 2011-07-14 DIAGNOSIS — Z79899 Other long term (current) drug therapy: Secondary | ICD-10-CM | POA: Diagnosis not present

## 2011-07-14 DIAGNOSIS — C50919 Malignant neoplasm of unspecified site of unspecified female breast: Secondary | ICD-10-CM | POA: Diagnosis not present

## 2011-07-14 DIAGNOSIS — Z7982 Long term (current) use of aspirin: Secondary | ICD-10-CM | POA: Diagnosis not present

## 2011-07-14 DIAGNOSIS — Z510 Encounter for antineoplastic radiation therapy: Secondary | ICD-10-CM | POA: Diagnosis not present

## 2011-07-14 DIAGNOSIS — L589 Radiodermatitis, unspecified: Secondary | ICD-10-CM | POA: Diagnosis not present

## 2011-07-14 DIAGNOSIS — Z170 Estrogen receptor positive status [ER+]: Secondary | ICD-10-CM | POA: Diagnosis not present

## 2011-07-14 DIAGNOSIS — C50119 Malignant neoplasm of central portion of unspecified female breast: Secondary | ICD-10-CM | POA: Diagnosis not present

## 2011-07-15 ENCOUNTER — Ambulatory Visit
Admission: RE | Admit: 2011-07-15 | Discharge: 2011-07-15 | Disposition: A | Discharge status: Home / Self Care | Payer: Medicare Other | Source: Ambulatory Visit | Attending: Radiation Oncology | Admitting: Radiation Oncology

## 2011-07-15 DIAGNOSIS — L589 Radiodermatitis, unspecified: Secondary | ICD-10-CM | POA: Diagnosis not present

## 2011-07-15 DIAGNOSIS — Z79899 Other long term (current) drug therapy: Secondary | ICD-10-CM | POA: Diagnosis not present

## 2011-07-15 DIAGNOSIS — Z7982 Long term (current) use of aspirin: Secondary | ICD-10-CM | POA: Diagnosis not present

## 2011-07-15 DIAGNOSIS — C50919 Malignant neoplasm of unspecified site of unspecified female breast: Secondary | ICD-10-CM | POA: Diagnosis not present

## 2011-07-15 DIAGNOSIS — C50119 Malignant neoplasm of central portion of unspecified female breast: Secondary | ICD-10-CM

## 2011-07-15 DIAGNOSIS — Z170 Estrogen receptor positive status [ER+]: Secondary | ICD-10-CM | POA: Diagnosis not present

## 2011-07-15 DIAGNOSIS — Z510 Encounter for antineoplastic radiation therapy: Secondary | ICD-10-CM | POA: Diagnosis not present

## 2011-07-15 MED ORDER — RADIAPLEXRX EX GEL
Freq: Once | CUTANEOUS | Status: AC
  Administered 2011-07-15: 18:00:00 via TOPICAL

## 2011-07-15 NOTE — Progress Notes (Signed)
Encounter addended by: Agnes Lawrence, RN on: 07/15/2011  5:47 PM<BR>     Documentation filed: Inpatient MAR, Orders

## 2011-07-15 NOTE — Progress Notes (Signed)
Patient presents to the clinic today unaccompanied for post sim education with Sam, Charity fundraiser. Patient is alert and oriented to person, place, and time. No distress noted. Steady gait noted. Pleasant affect noted. Patient denies pain at this time. Oriented patient to staff and routine of the clinic. Provided patient with RADIATION THERAPY AND YOU handbook then, reviewed pertinent information. Provided patient with Radiaplex and Alra then, reviewed use. Reviewed potential side effects and management. All questions answered. Encouraged patient to call with needs. Patient verbalized understanding of all things reviewed.

## 2011-07-16 ENCOUNTER — Ambulatory Visit
Admission: RE | Admit: 2011-07-16 | Discharge: 2011-07-16 | Disposition: A | Discharge status: Home / Self Care | Payer: Medicare Other | Source: Ambulatory Visit | Attending: Radiation Oncology | Admitting: Radiation Oncology

## 2011-07-16 DIAGNOSIS — Z510 Encounter for antineoplastic radiation therapy: Secondary | ICD-10-CM | POA: Diagnosis not present

## 2011-07-16 DIAGNOSIS — C50919 Malignant neoplasm of unspecified site of unspecified female breast: Secondary | ICD-10-CM | POA: Diagnosis not present

## 2011-07-16 DIAGNOSIS — Z79899 Other long term (current) drug therapy: Secondary | ICD-10-CM | POA: Diagnosis not present

## 2011-07-16 DIAGNOSIS — Z7982 Long term (current) use of aspirin: Secondary | ICD-10-CM | POA: Diagnosis not present

## 2011-07-16 DIAGNOSIS — L589 Radiodermatitis, unspecified: Secondary | ICD-10-CM | POA: Diagnosis not present

## 2011-07-16 DIAGNOSIS — Z170 Estrogen receptor positive status [ER+]: Secondary | ICD-10-CM | POA: Diagnosis not present

## 2011-07-19 ENCOUNTER — Ambulatory Visit
Admission: RE | Admit: 2011-07-19 | Discharge: 2011-07-19 | Disposition: A | Discharge status: Home / Self Care | Payer: Medicare Other | Source: Ambulatory Visit | Attending: Radiation Oncology | Admitting: Radiation Oncology

## 2011-07-19 DIAGNOSIS — Z7982 Long term (current) use of aspirin: Secondary | ICD-10-CM | POA: Diagnosis not present

## 2011-07-19 DIAGNOSIS — Z510 Encounter for antineoplastic radiation therapy: Secondary | ICD-10-CM | POA: Diagnosis not present

## 2011-07-19 DIAGNOSIS — Z170 Estrogen receptor positive status [ER+]: Secondary | ICD-10-CM | POA: Diagnosis not present

## 2011-07-19 DIAGNOSIS — L589 Radiodermatitis, unspecified: Secondary | ICD-10-CM | POA: Diagnosis not present

## 2011-07-19 DIAGNOSIS — Z79899 Other long term (current) drug therapy: Secondary | ICD-10-CM | POA: Diagnosis not present

## 2011-07-19 DIAGNOSIS — C50919 Malignant neoplasm of unspecified site of unspecified female breast: Secondary | ICD-10-CM | POA: Diagnosis not present

## 2011-07-20 ENCOUNTER — Encounter: Payer: Self-pay | Admitting: Radiation Oncology

## 2011-07-20 ENCOUNTER — Ambulatory Visit
Admission: RE | Admit: 2011-07-20 | Discharge: 2011-07-20 | Disposition: A | Discharge status: Home / Self Care | Payer: Medicare Other | Source: Ambulatory Visit | Attending: Radiation Oncology | Admitting: Radiation Oncology

## 2011-07-20 VITALS — BP 141/92 | HR 80 | Resp 18 | Wt 133.5 lb

## 2011-07-20 DIAGNOSIS — Z7982 Long term (current) use of aspirin: Secondary | ICD-10-CM | POA: Diagnosis not present

## 2011-07-20 DIAGNOSIS — Z510 Encounter for antineoplastic radiation therapy: Secondary | ICD-10-CM | POA: Diagnosis not present

## 2011-07-20 DIAGNOSIS — Z170 Estrogen receptor positive status [ER+]: Secondary | ICD-10-CM | POA: Diagnosis not present

## 2011-07-20 DIAGNOSIS — Z79899 Other long term (current) drug therapy: Secondary | ICD-10-CM | POA: Diagnosis not present

## 2011-07-20 DIAGNOSIS — C50119 Malignant neoplasm of central portion of unspecified female breast: Secondary | ICD-10-CM

## 2011-07-20 DIAGNOSIS — L589 Radiodermatitis, unspecified: Secondary | ICD-10-CM | POA: Diagnosis not present

## 2011-07-20 DIAGNOSIS — C50919 Malignant neoplasm of unspecified site of unspecified female breast: Secondary | ICD-10-CM | POA: Diagnosis not present

## 2011-07-20 NOTE — Progress Notes (Signed)
Patient presents to the clinic today unaccompanied for an under treat visit with Dr. Michell Heinrich. Patient is alert and oriented to person, place, and time. No distress noted. Steady gait noted. Pleasant affect noted. Patient denies pain. No skin changes noted to right/treated breast. Patient reports using Radiaplex bid as directed. Patient has no complaints at this time. Reported all findings to Dr. Michell Heinrich.

## 2011-07-20 NOTE — Progress Notes (Signed)
Weekly Management Note Current Dose: 22  Gy  Projected Dose: 60 Gy   Narrative:  The patient presents for routine under treatment assessment.  CBCT/MVCT images/Port film x-rays were reviewed.  The chart was checked. Doing well. Using radiaplex.  Physical Findings: Weight: 133 lb 8 oz (60.555 kg). Unchanged. Central lumpectomy.  Impression:  The patient is tolerating radiation.  Plan:  Continue treatment as planned. Continue radiaplex.

## 2011-07-21 ENCOUNTER — Ambulatory Visit
Admission: RE | Admit: 2011-07-21 | Discharge: 2011-07-21 | Disposition: A | Discharge status: Home / Self Care | Payer: Medicare Other | Source: Ambulatory Visit | Attending: Radiation Oncology | Admitting: Radiation Oncology

## 2011-07-21 DIAGNOSIS — Z7982 Long term (current) use of aspirin: Secondary | ICD-10-CM | POA: Diagnosis not present

## 2011-07-21 DIAGNOSIS — Z510 Encounter for antineoplastic radiation therapy: Secondary | ICD-10-CM | POA: Diagnosis not present

## 2011-07-21 DIAGNOSIS — Z170 Estrogen receptor positive status [ER+]: Secondary | ICD-10-CM | POA: Diagnosis not present

## 2011-07-21 DIAGNOSIS — Z79899 Other long term (current) drug therapy: Secondary | ICD-10-CM | POA: Diagnosis not present

## 2011-07-21 DIAGNOSIS — L589 Radiodermatitis, unspecified: Secondary | ICD-10-CM | POA: Diagnosis not present

## 2011-07-21 DIAGNOSIS — C50119 Malignant neoplasm of central portion of unspecified female breast: Secondary | ICD-10-CM | POA: Diagnosis not present

## 2011-07-21 DIAGNOSIS — C50919 Malignant neoplasm of unspecified site of unspecified female breast: Secondary | ICD-10-CM | POA: Diagnosis not present

## 2011-07-22 ENCOUNTER — Ambulatory Visit
Admission: RE | Admit: 2011-07-22 | Discharge: 2011-07-22 | Disposition: A | Discharge status: Home / Self Care | Payer: Medicare Other | Source: Ambulatory Visit | Attending: Radiation Oncology | Admitting: Radiation Oncology

## 2011-07-22 DIAGNOSIS — Z510 Encounter for antineoplastic radiation therapy: Secondary | ICD-10-CM | POA: Diagnosis not present

## 2011-07-22 DIAGNOSIS — L589 Radiodermatitis, unspecified: Secondary | ICD-10-CM | POA: Diagnosis not present

## 2011-07-22 DIAGNOSIS — C50919 Malignant neoplasm of unspecified site of unspecified female breast: Secondary | ICD-10-CM | POA: Diagnosis not present

## 2011-07-22 DIAGNOSIS — Z7982 Long term (current) use of aspirin: Secondary | ICD-10-CM | POA: Diagnosis not present

## 2011-07-22 DIAGNOSIS — Z170 Estrogen receptor positive status [ER+]: Secondary | ICD-10-CM | POA: Diagnosis not present

## 2011-07-22 DIAGNOSIS — Z79899 Other long term (current) drug therapy: Secondary | ICD-10-CM | POA: Diagnosis not present

## 2011-07-23 ENCOUNTER — Ambulatory Visit
Admission: RE | Admit: 2011-07-23 | Discharge: 2011-07-23 | Disposition: A | Discharge status: Home / Self Care | Payer: Medicare Other | Source: Ambulatory Visit | Attending: Radiation Oncology | Admitting: Radiation Oncology

## 2011-07-23 DIAGNOSIS — Z79899 Other long term (current) drug therapy: Secondary | ICD-10-CM | POA: Diagnosis not present

## 2011-07-23 DIAGNOSIS — Z7982 Long term (current) use of aspirin: Secondary | ICD-10-CM | POA: Diagnosis not present

## 2011-07-23 DIAGNOSIS — Z510 Encounter for antineoplastic radiation therapy: Secondary | ICD-10-CM | POA: Diagnosis not present

## 2011-07-23 DIAGNOSIS — C50919 Malignant neoplasm of unspecified site of unspecified female breast: Secondary | ICD-10-CM | POA: Diagnosis not present

## 2011-07-23 DIAGNOSIS — L589 Radiodermatitis, unspecified: Secondary | ICD-10-CM | POA: Diagnosis not present

## 2011-07-23 DIAGNOSIS — Z170 Estrogen receptor positive status [ER+]: Secondary | ICD-10-CM | POA: Diagnosis not present

## 2011-07-26 ENCOUNTER — Ambulatory Visit
Admission: RE | Admit: 2011-07-26 | Discharge: 2011-07-26 | Disposition: A | Discharge status: Home / Self Care | Payer: Medicare Other | Source: Ambulatory Visit | Attending: Radiation Oncology | Admitting: Radiation Oncology

## 2011-07-26 DIAGNOSIS — Z79899 Other long term (current) drug therapy: Secondary | ICD-10-CM | POA: Diagnosis not present

## 2011-07-26 DIAGNOSIS — Z170 Estrogen receptor positive status [ER+]: Secondary | ICD-10-CM | POA: Diagnosis not present

## 2011-07-26 DIAGNOSIS — L589 Radiodermatitis, unspecified: Secondary | ICD-10-CM | POA: Diagnosis not present

## 2011-07-26 DIAGNOSIS — Z7982 Long term (current) use of aspirin: Secondary | ICD-10-CM | POA: Diagnosis not present

## 2011-07-26 DIAGNOSIS — Z510 Encounter for antineoplastic radiation therapy: Secondary | ICD-10-CM | POA: Diagnosis not present

## 2011-07-26 DIAGNOSIS — C50919 Malignant neoplasm of unspecified site of unspecified female breast: Secondary | ICD-10-CM | POA: Diagnosis not present

## 2011-07-27 ENCOUNTER — Ambulatory Visit
Admission: RE | Admit: 2011-07-27 | Discharge: 2011-07-27 | Disposition: A | Discharge status: Home / Self Care | Payer: Medicare Other | Source: Ambulatory Visit | Attending: Radiation Oncology | Admitting: Radiation Oncology

## 2011-07-27 ENCOUNTER — Encounter: Payer: Self-pay | Admitting: Radiation Oncology

## 2011-07-27 VITALS — BP 137/73 | HR 80 | Resp 18 | Wt 130.9 lb

## 2011-07-27 DIAGNOSIS — L589 Radiodermatitis, unspecified: Secondary | ICD-10-CM | POA: Diagnosis not present

## 2011-07-27 DIAGNOSIS — Z170 Estrogen receptor positive status [ER+]: Secondary | ICD-10-CM | POA: Diagnosis not present

## 2011-07-27 DIAGNOSIS — Z79899 Other long term (current) drug therapy: Secondary | ICD-10-CM | POA: Diagnosis not present

## 2011-07-27 DIAGNOSIS — Z510 Encounter for antineoplastic radiation therapy: Secondary | ICD-10-CM | POA: Diagnosis not present

## 2011-07-27 DIAGNOSIS — C50119 Malignant neoplasm of central portion of unspecified female breast: Secondary | ICD-10-CM

## 2011-07-27 DIAGNOSIS — Z7982 Long term (current) use of aspirin: Secondary | ICD-10-CM | POA: Diagnosis not present

## 2011-07-27 DIAGNOSIS — C50919 Malignant neoplasm of unspecified site of unspecified female breast: Secondary | ICD-10-CM | POA: Diagnosis not present

## 2011-07-27 NOTE — Progress Notes (Signed)
   Department of Radiation Oncology  Phone:  [phone] Fax:        [phone]   Weekly Management Note Current Dose:  26.7 Gy  Projected Dose: 42.72 Gy   Narrative:  The patient presents for routine under treatment assessment.  Port film x-rays were reviewed.  The chart was checked. Is tolerating the treatments well at this time. She denies any significant fatigue itching or discomfort in the breast area.  Physical Findings: Weight: 130 lb 14.4 oz (59.376 kg). The lungs are clear. The heart has regular rhythm and rate. Examination right breast reveals the nipple areolar complex to be surgically absent. There is a erythema and radiation dermatitis throughout much of the breast but no moist desquamation.  Impression:  The patient is tolerating radiation.  Plan:  Continue treatment as planned with hyperfractionated accelerated radiation therapy.  -----------------------------------  Billie Lade, PhD, MD

## 2011-07-27 NOTE — Progress Notes (Signed)
Patient presents to the clinic today unaccompanied for an under treat visit with Dr. Roselind Messier. Patient is alert and oriented to person, place, and time. No distress noted. Steady gait noted. Pleasant affect noted. Patient denies breast pain at this time. Hyperpigmentation with desquamation of the right/treated breast noted. Patient reports using Radiaplex as directed. Patient has no complaints at this time. Reported all findings to Dr. Roselind Messier.

## 2011-07-28 ENCOUNTER — Ambulatory Visit
Admission: RE | Admit: 2011-07-28 | Discharge: 2011-07-28 | Disposition: A | Discharge status: Home / Self Care | Payer: Medicare Other | Source: Ambulatory Visit | Attending: Radiation Oncology | Admitting: Radiation Oncology

## 2011-07-28 DIAGNOSIS — C50919 Malignant neoplasm of unspecified site of unspecified female breast: Secondary | ICD-10-CM | POA: Diagnosis not present

## 2011-07-28 DIAGNOSIS — Z79899 Other long term (current) drug therapy: Secondary | ICD-10-CM | POA: Diagnosis not present

## 2011-07-28 DIAGNOSIS — Z7982 Long term (current) use of aspirin: Secondary | ICD-10-CM | POA: Diagnosis not present

## 2011-07-28 DIAGNOSIS — Z170 Estrogen receptor positive status [ER+]: Secondary | ICD-10-CM | POA: Diagnosis not present

## 2011-07-28 DIAGNOSIS — Z510 Encounter for antineoplastic radiation therapy: Secondary | ICD-10-CM | POA: Diagnosis not present

## 2011-07-28 DIAGNOSIS — L589 Radiodermatitis, unspecified: Secondary | ICD-10-CM | POA: Diagnosis not present

## 2011-07-28 DIAGNOSIS — C50119 Malignant neoplasm of central portion of unspecified female breast: Secondary | ICD-10-CM | POA: Diagnosis not present

## 2011-07-29 ENCOUNTER — Ambulatory Visit
Admission: RE | Admit: 2011-07-29 | Discharge: 2011-07-29 | Disposition: A | Discharge status: Home / Self Care | Payer: Medicare Other | Source: Ambulatory Visit | Attending: Radiation Oncology | Admitting: Radiation Oncology

## 2011-07-29 DIAGNOSIS — Z170 Estrogen receptor positive status [ER+]: Secondary | ICD-10-CM | POA: Diagnosis not present

## 2011-07-29 DIAGNOSIS — Z510 Encounter for antineoplastic radiation therapy: Secondary | ICD-10-CM | POA: Diagnosis not present

## 2011-07-29 DIAGNOSIS — Z7982 Long term (current) use of aspirin: Secondary | ICD-10-CM | POA: Diagnosis not present

## 2011-07-29 DIAGNOSIS — C50919 Malignant neoplasm of unspecified site of unspecified female breast: Secondary | ICD-10-CM | POA: Diagnosis not present

## 2011-07-29 DIAGNOSIS — Z79899 Other long term (current) drug therapy: Secondary | ICD-10-CM | POA: Diagnosis not present

## 2011-07-29 DIAGNOSIS — L589 Radiodermatitis, unspecified: Secondary | ICD-10-CM | POA: Diagnosis not present

## 2011-07-30 ENCOUNTER — Ambulatory Visit
Admission: RE | Admit: 2011-07-30 | Discharge: 2011-07-30 | Disposition: A | Discharge status: Home / Self Care | Payer: Medicare Other | Source: Ambulatory Visit | Attending: Radiation Oncology | Admitting: Radiation Oncology

## 2011-07-30 DIAGNOSIS — Z170 Estrogen receptor positive status [ER+]: Secondary | ICD-10-CM | POA: Diagnosis not present

## 2011-07-30 DIAGNOSIS — Z7982 Long term (current) use of aspirin: Secondary | ICD-10-CM | POA: Diagnosis not present

## 2011-07-30 DIAGNOSIS — Z510 Encounter for antineoplastic radiation therapy: Secondary | ICD-10-CM | POA: Diagnosis not present

## 2011-07-30 DIAGNOSIS — C50919 Malignant neoplasm of unspecified site of unspecified female breast: Secondary | ICD-10-CM | POA: Diagnosis not present

## 2011-07-30 DIAGNOSIS — L589 Radiodermatitis, unspecified: Secondary | ICD-10-CM | POA: Diagnosis not present

## 2011-07-30 DIAGNOSIS — Z79899 Other long term (current) drug therapy: Secondary | ICD-10-CM | POA: Diagnosis not present

## 2011-08-02 ENCOUNTER — Ambulatory Visit
Admission: RE | Admit: 2011-08-02 | Discharge: 2011-08-02 | Disposition: A | Discharge status: Home / Self Care | Payer: Medicare Other | Source: Ambulatory Visit | Attending: Radiation Oncology | Admitting: Radiation Oncology

## 2011-08-02 DIAGNOSIS — C50919 Malignant neoplasm of unspecified site of unspecified female breast: Secondary | ICD-10-CM | POA: Diagnosis not present

## 2011-08-02 DIAGNOSIS — Z7982 Long term (current) use of aspirin: Secondary | ICD-10-CM | POA: Diagnosis not present

## 2011-08-02 DIAGNOSIS — Z510 Encounter for antineoplastic radiation therapy: Secondary | ICD-10-CM | POA: Diagnosis not present

## 2011-08-02 DIAGNOSIS — Z79899 Other long term (current) drug therapy: Secondary | ICD-10-CM | POA: Diagnosis not present

## 2011-08-02 DIAGNOSIS — Z170 Estrogen receptor positive status [ER+]: Secondary | ICD-10-CM | POA: Diagnosis not present

## 2011-08-02 DIAGNOSIS — L589 Radiodermatitis, unspecified: Secondary | ICD-10-CM | POA: Diagnosis not present

## 2011-08-03 ENCOUNTER — Ambulatory Visit
Admission: RE | Admit: 2011-08-03 | Discharge: 2011-08-03 | Disposition: A | Discharge status: Home / Self Care | Payer: Medicare Other | Source: Ambulatory Visit | Attending: Radiation Oncology | Admitting: Radiation Oncology

## 2011-08-03 ENCOUNTER — Encounter: Payer: Self-pay | Admitting: Radiation Oncology

## 2011-08-03 VITALS — Resp 18 | Wt 135.4 lb

## 2011-08-03 DIAGNOSIS — C50919 Malignant neoplasm of unspecified site of unspecified female breast: Secondary | ICD-10-CM | POA: Diagnosis not present

## 2011-08-03 DIAGNOSIS — Z7982 Long term (current) use of aspirin: Secondary | ICD-10-CM | POA: Diagnosis not present

## 2011-08-03 DIAGNOSIS — Z79899 Other long term (current) drug therapy: Secondary | ICD-10-CM | POA: Diagnosis not present

## 2011-08-03 DIAGNOSIS — L589 Radiodermatitis, unspecified: Secondary | ICD-10-CM | POA: Diagnosis not present

## 2011-08-03 DIAGNOSIS — Z510 Encounter for antineoplastic radiation therapy: Secondary | ICD-10-CM | POA: Diagnosis not present

## 2011-08-03 DIAGNOSIS — Z170 Estrogen receptor positive status [ER+]: Secondary | ICD-10-CM | POA: Diagnosis not present

## 2011-08-03 DIAGNOSIS — C50119 Malignant neoplasm of central portion of unspecified female breast: Secondary | ICD-10-CM

## 2011-08-03 MED ORDER — RADIAPLEXRX EX GEL
Freq: Once | CUTANEOUS | Status: AC
  Administered 2011-08-03: 19:00:00 via TOPICAL

## 2011-08-03 NOTE — Addendum Note (Signed)
Encounter addended by: Agnes Lawrence, RN on: 08/03/2011  6:37 PM<BR>     Documentation filed: Inpatient MAR, Orders

## 2011-08-03 NOTE — Progress Notes (Signed)
Patient presents to the clinic today for PUT with Dr. Michell Heinrich. Patient is alert and oriented to person, place, and time. No distress noted. Steady gait noted. Pleasant affect noted. Patient denies pain at this time. Patient reports using Radiaplex bid as directed. Gave patient addition tube of radiaplex today. Reinforced skin care. Will call patient tomorrow with one month follow up appointment. Patient scheduled to complete treatment in two days. Hyperpigmentation without desquamation of right/treated breast noted. Reported all findings to Dr. Michell Heinrich.

## 2011-08-03 NOTE — Progress Notes (Signed)
Weekly Management Note Current Dose: 40.05  Gy  Projected Dose: 42.72 Gy   Narrative:  The patient presents for routine under treatment assessment.  CBCT/MVCT images/Port film x-rays were reviewed.  The chart was checked. Skin irritated. Helps when she is not wearing a bra. Using radiaplex.  Physical Findings: Weight: 135 lb 6.4 oz (61.417 kg). Dermatitis throughout breast. No moist desquamation.  Impression:  The patient is tolerating radiation.  Plan:  Continue treatment as planned. Discussed radiaplex x 2 weeks then lotion with vit e.

## 2011-08-04 ENCOUNTER — Encounter: Payer: Self-pay | Admitting: Radiation Oncology

## 2011-08-04 ENCOUNTER — Ambulatory Visit: Payer: Medicare Other

## 2011-08-04 ENCOUNTER — Ambulatory Visit
Admission: RE | Admit: 2011-08-04 | Discharge: 2011-08-04 | Disposition: A | Discharge status: Home / Self Care | Payer: Medicare Other | Source: Ambulatory Visit | Attending: Radiation Oncology | Admitting: Radiation Oncology

## 2011-08-04 DIAGNOSIS — Z510 Encounter for antineoplastic radiation therapy: Secondary | ICD-10-CM | POA: Diagnosis not present

## 2011-08-04 DIAGNOSIS — C50919 Malignant neoplasm of unspecified site of unspecified female breast: Secondary | ICD-10-CM | POA: Diagnosis not present

## 2011-08-04 DIAGNOSIS — Z79899 Other long term (current) drug therapy: Secondary | ICD-10-CM | POA: Diagnosis not present

## 2011-08-04 DIAGNOSIS — L589 Radiodermatitis, unspecified: Secondary | ICD-10-CM | POA: Diagnosis not present

## 2011-08-04 DIAGNOSIS — Z7982 Long term (current) use of aspirin: Secondary | ICD-10-CM | POA: Diagnosis not present

## 2011-08-04 DIAGNOSIS — Z170 Estrogen receptor positive status [ER+]: Secondary | ICD-10-CM | POA: Diagnosis not present

## 2011-08-05 ENCOUNTER — Ambulatory Visit: Payer: Medicare Other

## 2011-08-06 ENCOUNTER — Ambulatory Visit: Payer: Medicare Other

## 2011-08-06 DIAGNOSIS — S01501A Unspecified open wound of lip, initial encounter: Secondary | ICD-10-CM | POA: Diagnosis not present

## 2011-08-06 DIAGNOSIS — S025XXA Fracture of tooth (traumatic), initial encounter for closed fracture: Secondary | ICD-10-CM | POA: Diagnosis not present

## 2011-08-06 DIAGNOSIS — W010XXA Fall on same level from slipping, tripping and stumbling without subsequent striking against object, initial encounter: Secondary | ICD-10-CM | POA: Diagnosis not present

## 2011-08-09 ENCOUNTER — Ambulatory Visit: Payer: Medicare Other

## 2011-08-09 NOTE — Progress Notes (Signed)
  Radiation Oncology         [phone] ________________________________  Name: Samantha Weeks MRN: [id number]  Date: 08/04/2011  DOB: 03/17/1940  End of Treatment Note  Diagnosis:   T1 N0 right breast cancer   Indication for treatment:  Curative       Radiation treatment dates:  07/14/2011-08/04/2011  Site/dose:  Right breast / 42.72 Wallace Cullens @ 2.67 Wallace Cullens per fraction x 16 fractions  Beams/energy:  Opposed tangents with reduced fields / 6 & 10 MV photons  Narrative: The patient tolerated radiation treatment relatively well.  She had the expected dermatitis and fatigue but was able to maintain a normal work schedule.   Plan: The patient has completed radiation treatment. The patient will return to radiation oncology clinic for routine followup in one month. I advised them to call or return sooner if they have any questions or concerns related to their recovery or treatment.  ------------------------------------------------  Lurline Hare, MD

## 2011-08-10 ENCOUNTER — Ambulatory Visit: Payer: Medicare Other

## 2011-08-11 ENCOUNTER — Ambulatory Visit: Payer: Medicare Other

## 2011-08-13 ENCOUNTER — Ambulatory Visit: Payer: Medicare Other

## 2011-08-16 ENCOUNTER — Ambulatory Visit: Payer: Medicare Other

## 2011-08-17 ENCOUNTER — Ambulatory Visit: Payer: Medicare Other

## 2011-08-18 ENCOUNTER — Ambulatory Visit: Payer: Medicare Other

## 2011-08-19 ENCOUNTER — Ambulatory Visit: Payer: Medicare Other

## 2011-08-20 ENCOUNTER — Ambulatory Visit: Payer: Medicare Other

## 2011-08-23 ENCOUNTER — Ambulatory Visit: Payer: Medicare Other

## 2011-08-24 ENCOUNTER — Ambulatory Visit: Payer: Medicare Other

## 2011-08-25 ENCOUNTER — Ambulatory Visit: Payer: Medicare Other

## 2011-08-26 ENCOUNTER — Ambulatory Visit: Payer: Medicare Other

## 2011-08-27 ENCOUNTER — Ambulatory Visit: Payer: Medicare Other

## 2011-09-02 ENCOUNTER — Encounter: Payer: Self-pay | Admitting: Radiation Oncology

## 2011-09-02 ENCOUNTER — Ambulatory Visit
Admission: RE | Admit: 2011-09-02 | Discharge: 2011-09-02 | Disposition: A | Discharge status: Home / Self Care | Payer: Medicare Other | Source: Ambulatory Visit | Attending: Radiation Oncology | Admitting: Radiation Oncology

## 2011-09-02 VITALS — BP 155/81 | HR 91 | Temp 97.9°F | Resp 20 | Wt 135.9 lb

## 2011-09-02 DIAGNOSIS — C50119 Malignant neoplasm of central portion of unspecified female breast: Secondary | ICD-10-CM

## 2011-09-02 NOTE — Progress Notes (Addendum)
Pt denies pain, fatigue, states her skin in tx area of right breast is healed. Pt states she refused to take hormone med that Dr Welton Flakes advised.

## 2011-09-02 NOTE — Progress Notes (Signed)
   Department of Radiation Oncology  Phone:  [phone] Fax:        [phone]   Name: Samantha Weeks   DOB: [birth year]  MRN: [id number]    Date: 09/02/2011  Follow Up Visit Note  Diagnosis: T1N0 Right breast cancer  Interval since last radiation: 1 month  Interval History: Samantha Weeks presents today for routine followup.  Her skin became more red and irritated after treatment was over. It is healed up now. She is not having any pain. She met with Dr. Welton Flakes and decided against antiestrogen therapy. She is supposed to see Dr. Ezzard Standing in November. She is using lotion with vitamin E.  Allergies: No Known Allergies  Medications:  Current Outpatient Prescriptions  Medication Sig Dispense Refill  . calcium citrate (CALCITRATE - DOSED IN MG ELEMENTAL CALCIUM) 950 MG tablet Take 1 tablet by mouth daily.      . calcium-vitamin D (OSCAL WITH D) 500-200 MG-UNIT per tablet Take 2 tablets by mouth daily.      . fish oil-omega-3 fatty acids 1000 MG capsule Take 1 g by mouth daily.      Marland Kitchen levothyroxine (SYNTHROID, LEVOTHROID) 50 MCG tablet Take by mouth daily.       . Multiple Vitamin (MULTIVITAMIN) tablet Take 1 tablet by mouth daily.      . nortriptyline (PAMELOR) 50 MG capsule Take 100 mg by mouth daily.        Physical Exam:   weight is 135 lb 14.4 oz (61.644 kg). Her oral temperature is 97.9 F (36.6 C). Her blood pressure is 155/81 and her pulse is 91. Her respiration is 20.  Her right breast continues to be slightly pink as compared to the left. There are small pearllike bumps along her sentinel lymph node biopsy scar.  IMPRESSION: Samantha Weeks is a 71 y.o. female with T1 N0 right breast cancer recovering from the acute effects of treatment  PLAN:  Limits great. Her skin is on its way to healing. She might have another month or so before it is totally healed. She is headed to the beach we talked about skin protection in the treated area. I told her given in these areas on her sentinel lymph node  scar. I think you're probably heal up on their own. If not initial present in the next couple weeks please give me a call. I will see her back in may. She'll see Dr. Ezzard Standing in November and I will schedule mammograms at that time.    Lurline Hare, MD

## 2011-09-23 ENCOUNTER — Ambulatory Visit: Payer: Medicare Other | Admitting: Radiation Oncology

## 2011-10-22 ENCOUNTER — Encounter: Payer: Self-pay | Admitting: Oncology

## 2011-10-22 ENCOUNTER — Other Ambulatory Visit (HOSPITAL_BASED_OUTPATIENT_CLINIC_OR_DEPARTMENT_OTHER): Payer: Medicare Other | Admitting: Lab

## 2011-10-22 ENCOUNTER — Ambulatory Visit (HOSPITAL_BASED_OUTPATIENT_CLINIC_OR_DEPARTMENT_OTHER): Payer: Medicare Other | Admitting: Oncology

## 2011-10-22 ENCOUNTER — Telehealth: Payer: Self-pay | Admitting: *Deleted

## 2011-10-22 VITALS — BP 158/82 | HR 92 | Temp 98.9°F | Resp 20 | Ht 61.0 in | Wt 134.2 lb

## 2011-10-22 DIAGNOSIS — C50119 Malignant neoplasm of central portion of unspecified female breast: Secondary | ICD-10-CM | POA: Diagnosis not present

## 2011-10-22 DIAGNOSIS — C773 Secondary and unspecified malignant neoplasm of axilla and upper limb lymph nodes: Secondary | ICD-10-CM

## 2011-10-22 DIAGNOSIS — C50019 Malignant neoplasm of nipple and areola, unspecified female breast: Secondary | ICD-10-CM | POA: Diagnosis not present

## 2011-10-22 DIAGNOSIS — Z170 Estrogen receptor positive status [ER+]: Secondary | ICD-10-CM | POA: Diagnosis not present

## 2011-10-22 LAB — CBC WITH DIFFERENTIAL/PLATELET
BASO%: 1.3 % (ref 0.0–2.0)
Basophils Absolute: 0.1 10*3/uL (ref 0.0–0.1)
EOS%: 4.2 % (ref 0.0–7.0)
HCT: 38.4 % (ref 34.8–46.6)
HGB: 13 g/dL (ref 11.6–15.9)
MCH: 29.7 pg (ref 25.1–34.0)
MCHC: 33.9 g/dL (ref 31.5–36.0)
MONO#: 0.4 10*3/uL (ref 0.1–0.9)
NEUT%: 53.6 % (ref 38.4–76.8)
RDW: 13.2 % (ref 11.2–14.5)
WBC: 5.6 10*3/uL (ref 3.9–10.3)
lymph#: 1.9 10*3/uL (ref 0.9–3.3)

## 2011-10-22 LAB — COMPREHENSIVE METABOLIC PANEL (CC13)
ALT: 21 U/L (ref 0–55)
AST: 24 U/L (ref 5–34)
Albumin: 3.7 g/dL (ref 3.5–5.0)
CO2: 24 mEq/L (ref 22–29)
Calcium: 10.1 mg/dL (ref 8.4–10.4)
Chloride: 106 mEq/L (ref 98–107)
Creatinine: 0.9 mg/dL (ref 0.6–1.1)
Potassium: 3.9 mEq/L (ref 3.5–5.1)

## 2011-10-22 NOTE — Progress Notes (Signed)
OFFICE PROGRESS NOTE  CC  HUSAIN,KARRAR, MD [address] [place] [address] Dr. Ovidio Kin Dr. Lurline Hare   DIAGNOSIS: 71 year old with Stage I IDC s/p central lumpectomy on 06/15/11  PRIOR THERAPY: 1. S/P central lumpectomy for a 1.3 cm IDC, ER+ 0/1 lymph node positive for mets  #2 patient is now status post radiation therapy to the right breast completed June 2013.  #3 patient was recommended antiestrogen therapy but she has now declined.   CURRENT THERAPY:observation patient declined any further adjuvant treatment.  INTERVAL HISTORY: Samantha Weeks 71 y.o. female returns for follow up visit After having completed her radiation therapy. Overall she's doing well she is without any complaints. She denies any nausea vomiting fevers chills night sweats headaches she has no shortness of breath or chest pains or palpitations. She still remains very adamant regarding any further adjuvant treatment especially with an antiestrogen therapy. She does not want it. But she does want to be seen by me on a periodic basis. Remainder of the 10 point review of systems is negative.  MEDICAL HISTORY: Past Medical History  Diagnosis Date  . Hx of colonoscopy   . Hx of bone density study   . Depression   . Breast cancer     RT BREAST  . Hypothyroidism     ALLERGIES:   has no known allergies.  MEDICATIONS:  Current Outpatient Prescriptions  Medication Sig Dispense Refill  . calcium citrate (CALCITRATE - DOSED IN MG ELEMENTAL CALCIUM) 950 MG tablet Take 1 tablet by mouth daily.      . calcium-vitamin D (OSCAL WITH D) 500-200 MG-UNIT per tablet Take 2 tablets by mouth daily.      . fish oil-omega-3 fatty acids 1000 MG capsule Take 1 g by mouth daily.      Marland Kitchen levothyroxine (SYNTHROID, LEVOTHROID) 50 MCG tablet Take by mouth daily.       . Multiple Vitamin (MULTIVITAMIN) tablet Take 1 tablet by mouth daily.      . nortriptyline (PAMELOR) 50 MG capsule Take 100 mg by  mouth daily.        SURGICAL HISTORY:  Past Surgical History  Procedure Date  . Cataract extraction   . Tonsillectomy   . Eye surgery     BIL CATARACT AND RT RETINAL SX  . Breast surgery 06/15/2011    Right Br lumpectomy  . Abdominal hysterectomy     Complete    REVIEW OF SYSTEMS:  A comprehensive review of systems was negative.   PHYSICAL EXAMINATION: General appearance: alert, cooperative and appears stated age Resp: clear to auscultation bilaterally and normal percussion bilaterally Back: symmetric, no curvature. ROM normal. No CVA tenderness. Cardio: regular rate and rhythm, S1, S2 normal, no murmur, click, rub or gallop GI: soft, non-tender; bowel sounds normal; no masses,  no organomegaly Extremities: extremities normal, atraumatic, no cyanosis or edema Neurologic: Grossly normal Right breast incisional scar is well healed there is no nodularity no evidence of recurrent disease locally. ECOG PERFORMANCE STATUS: 0 - Asymptomatic  Blood pressure 158/82, pulse 92, temperature 98.9 F (37.2 C), temperature source Oral, resp. rate 20, height 5\' 1"  (1.549 m), weight 134 lb 3.2 oz (60.873 kg).  LABORATORY DATA: Lab Results  Component Value Date   WBC 5.6 10/22/2011   HGB 13.0 10/22/2011   HCT 38.4 10/22/2011   MCV 87.8 10/22/2011   PLT 291 10/22/2011      Chemistry      Component Value Date/Time  NA 141 06/02/2011 0814   K 3.7 06/02/2011 0814   CL 103 06/02/2011 0814   CO2 29 06/02/2011 0814   BUN 17 06/02/2011 0814   CREATININE 0.89 06/02/2011 0814      Component Value Date/Time   CALCIUM 9.5 06/02/2011 0814   ALKPHOS 79 06/02/2011 0814   AST 25 06/02/2011 0814   ALT 20 06/02/2011 0814   BILITOT 0.3 06/02/2011 0814       RADIOGRAPHIC STUDIES:  Nm Sentinel Node Inj-no Rpt (breast)  06/15/2011  CLINICAL DATA: right breast cancer.   Sulfur colloid was injected intradermally by the nuclear medicine  technologist for breast cancer sentinel node localization.     [place] Wire  Localization Right  06/15/2011  *RADIOLOGY REPORT*  Clinical Data:  Right breast cancer  RIGHT BREAST NEEDLE LOCALIZATION USING ULTRASOUND GUIDANCE AND SPECIMEN RADIOGRAPH  Patient presents for needle localization prior to surgical excision. The patient and I discussed the procedure of needle localization including benefits and alternatives. We discussed the high likelihood of a successful procedure. We discussed the risks of the procedure, including infection, bleeding, tissue injury and further surgery. Informed written consent was given.  Using ultrasound guidance, sterile technique, 2% lidocaine and a a 7 cm InRad Ultrawire, the mass in the right subareolar region was localized using a mediolateral approach. Films were labeled and sent with the patient to surgery.  She tolerated the procedure well.  Specimen radiograph is performed at [facility] and confirms the clip, wire and mass to be present in the tissue sample.  The specimen is marked for pathology.  IMPRESSION: Needle localization right breast.  No apparent complications.  Original Report Authenticated By: Daryl Eastern, M.D.   Mm Breast Surgical Specimen  06/15/2011  *RADIOLOGY REPORT*  Clinical Data:  Right breast cancer  RIGHT BREAST NEEDLE LOCALIZATION USING ULTRASOUND GUIDANCE AND SPECIMEN RADIOGRAPH  Patient presents for needle localization prior to surgical excision. The patient and I discussed the procedure of needle localization including benefits and alternatives. We discussed the high likelihood of a successful procedure. We discussed the risks of the procedure, including infection, bleeding, tissue injury and further surgery. Informed written consent was given.  Using ultrasound guidance, sterile technique, 2% lidocaine and a a 7 cm InRad Ultrawire, the mass in the right subareolar region was localized using a mediolateral approach. Films were labeled and sent with the patient to surgery.  She tolerated the procedure  well.  Specimen radiograph is performed at [facility] and confirms the clip, wire and mass to be present in the tissue sample.  The specimen is marked for pathology.  IMPRESSION: Needle localization right breast.  No apparent complications.  Original Report Authenticated By: Daryl Eastern, M.D.   PATHOLOGY  ADDITIONAL INFORMATION: 2. CHROMOGENIC IN-SITU HYBRIDIZATION Interpretation HER-2/NEU BY CISH - NO AMPLIFICATION OF HER-2 DETECTED. THE RATIO OF HER-2: CEP 17 SIGNALS WAS 1.19. Reference range: Ratio: HER2:CEP17 < 1.8 - gene amplification not observed Ratio: HER2:CEP 17 1.8-2.2 - equivocal result Ratio: HER2:CEP17 > 2.2 - gene amplification observed Abigail Miyamoto MD Pathologist, Electronic Signature ( Signed 06/22/2011) FINAL DIAGNOSIS Diagnosis 1. Lymph node, sentinel, biopsy, Right axilla - ONE BENIGN LYMPH NODE (0/1). 2. Breast, lumpectomy, Right - INVASIVE DUCTAL CARCINOMA, 1.3 CM. - MSBR GRADE I. - MARGINS NOT INVOLVED. Microscopic Comment 2. BREAST, INVASIVE TUMOR, WITH LYMPH NODE SAMPLING Specimen, including laterality: Right breast Procedure: Needle localized lumpectomy Grade: I 1 of 3 FINAL for Bucy, Mahrukh E (  [phone]) Microscopic Comment(continued) Tubule formation: 1 Nuclear pleomorphism: 2 Mitotic: 1 Tumor size (gross measurement): 1.3 cm Margins: Free of tumor Invasive, distance to closest margin: 0.9 cm from anterior resection margin In-situ, distance to closest margin: N/A If margin positive, focally or broadly: N/A Lymphovascular invasion: Not identified Ductal carcinoma in situ: No Grade: N/A Extensive intraductal component: N/A Lobular neoplasia: No Treatment effect: No If present, treatment effect in breast tissue, lymph nodes or both: N/A Extent of tumor: Skin: Free of tumor Nipple: Tumor extends into the subareolar connective tissue Skeletal muscle: N/A Lymph nodes: # examined: 1 Lymph nodes with metastasis:  0 Isolated tumor cells (< 0.2 mm): 0 Micrometastasis: (> 0.2 mm and < 2.0 mm): 0 Macrometastasis: (> 2.0 mm): 0 Extracapsular extension: N/A Breast prognostic profile: Case [id number] Estrogen receptor: 100%, positive Progesterone receptor: 100%, positive Her 2 neu: No amplification; ratio is 1.14; will be repeated on the current specimen. Ki-67: 12% Non-neoplastic breast: Fibrocystic changes TNM: pT1c, pN0, pMX (JDP:kh 06-17-11) Jimmy Picket MD Pathologist, Electronic Signature (Case signed 06/17/2011) Specimen Gross and Clinical I   ASSESSMENT: 71 year old female with stage I invasive ductal carcinoma, ER and PR positive Her2Neu negative. S/P lumpectomy with sentinel lymph node biopsy. Patient has now completed radiation therapy overall she tolerated it well. She is declining any further adjuvant treatment with an antiestrogen therapy agents such as aromatase inhibitors or tamoxifen. She feels that the risks of treatment would be worse than the benefits and therefore she does not want to deal with it.   PLAN:   Patient will be seen on a periodic basis. She has agreed to be seen by me once a year. Of course should we see me sooner if need arises.  All questions were answered. The patient knows to call the clinic with any problems, questions or concerns. We can certainly see the patient much sooner if necessary.  I spent 15 minutes counseling the patient face to face. The total time spent in the appointment was 30 minutes.    Drue Second, MD Medical/Oncology [facility] [phone] (beeper) [phone] (Office)  10/22/2011, 2:29 PM

## 2011-10-22 NOTE — Telephone Encounter (Signed)
Gave patient appointment for 10-20-2012 starting at 2:00pm

## 2011-10-22 NOTE — Patient Instructions (Addendum)
Doing well, breast exam looks good.  You declined any further adjuvant anti-estrogen therapy   I will see you back in 1 year

## 2012-01-24 ENCOUNTER — Encounter: Payer: Self-pay | Admitting: *Deleted

## 2012-01-26 ENCOUNTER — Ambulatory Visit (INDEPENDENT_AMBULATORY_CARE_PROVIDER_SITE_OTHER): Payer: Medicare Other | Admitting: Surgery

## 2012-01-26 ENCOUNTER — Encounter (INDEPENDENT_AMBULATORY_CARE_PROVIDER_SITE_OTHER): Payer: Self-pay | Admitting: Surgery

## 2012-01-26 VITALS — BP 132/74 | HR 76 | Temp 97.2°F | Resp 16 | Ht 62.0 in | Wt 136.8 lb

## 2012-01-26 DIAGNOSIS — C50119 Malignant neoplasm of central portion of unspecified female breast: Secondary | ICD-10-CM

## 2012-01-26 NOTE — Progress Notes (Signed)
Re:   Samantha Weeks DOB:   1940-06-21 MRN:   [id number]  BMD[facility]  ASSESSMENT AND PLAN: 1.  Right breast cancer, Central, behind the nipple. T1c, N0.  Central lumpectomy - 06/15/2011  1.3 cm IDC.  0/1 node  ER/PR - 100/100, Ki-67 - 12%, Her2Neu - neg.  Seeing Drs. Park Breed and Michell Heinrich.  She is disease free.  She decided against antiestrogen therapy.  I will see her back in 6 months.  2.  Thyroid replacement.  HISTORY OF PRESENT ILLNESS: Samantha Weeks is a 71 y.o. (DOB: 18-Apr-1940)  white female whose primary care physician is Dr. Burney Gauze and comes for follow up for right breast cancer.  She had a right breast central lumpectomy and right axillary SLNBx on 06/15/2011.    She is doing well with no new concern or abnormality.  She is for her next mammogram in March 2014.   Past Medical History  Diagnosis Date  . Hx of colonoscopy   . Hx of bone density study   . Depression   . Breast cancer     RT BREAST  . Hypothyroidism      Current Outpatient Prescriptions  Medication Sig Dispense Refill  . calcium citrate (CALCITRATE - DOSED IN MG ELEMENTAL CALCIUM) 950 MG tablet Take 1 tablet by mouth daily.      . calcium-vitamin D (OSCAL WITH D) 500-200 MG-UNIT per tablet Take 2 tablets by mouth daily.      . fish oil-omega-3 fatty acids 1000 MG capsule Take 1 g by mouth daily.      Marland Kitchen levothyroxine (SYNTHROID, LEVOTHROID) 50 MCG tablet Take by mouth daily.       . Multiple Vitamin (MULTIVITAMIN) tablet Take 1 tablet by mouth daily.      . nortriptyline (PAMELOR) 50 MG capsule Take 100 mg by mouth daily.         No Known Allergies  REVIEW OF SYSTEMS: Endocrine:  No diabetes. On thyroid replacement x 3 years. Gastrointestinal:  No history of stomach disease.  No history of liver disease.  No history of gall bladder disease.  No history of pancreas disease.  No history of colon disease.  Colonoscopy in 2007.  Unknown gastroenterologist. Urologic:  No history of kidney stones.  No history of bladder  infections. GYN:  Hysterectomy in 1980's for bleeding.  SOCIAL and FAMILY HISTORY: Divorced.  Lives by self. [name], a friend and breast cancer patient of mine, accompanied her today. No children. 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  PHYSICAL EXAM: BP 132/74  Pulse 76  Temp 97.2 F (36.2 C) (Temporal)  Resp 16  Ht 5\' 2"  (1.575 m)  Wt 136 lb 12.8 oz (62.052 kg)  BMI 25.02 kg/m2  HEENT:  Pupils equal.  Dentition good. NECK:  Supple.  No thyroid mass. LYMPH NODES:  No cervical, supraclavicular, or axillary adenopathy. BREASTS -  RIGHT:  Some pale redness to right breast - post radiation.  She has a linear scar where her nipple was.  No mass   LEFT:  No palpable mass or nodule.  No nipple discharge. UPPER EXTREMITIES:  No evidence of lymphedema.  DATA REVIEWED: No new data.  Ovidio Kin, MD,  [facility] [address] [place]  [address]   [place], [place]    [address] Phone:  [phone] FAX:  [phone]

## 2012-02-17 ENCOUNTER — Telehealth: Payer: Self-pay | Admitting: Oncology

## 2012-02-17 NOTE — Telephone Encounter (Signed)
Faxed pt medical records to [facility]. Per pt

## 2012-03-16 DIAGNOSIS — B370 Candidal stomatitis: Secondary | ICD-10-CM | POA: Diagnosis not present

## 2012-03-16 DIAGNOSIS — K1370 Unspecified lesions of oral mucosa: Secondary | ICD-10-CM | POA: Diagnosis not present

## 2012-03-22 DIAGNOSIS — B370 Candidal stomatitis: Secondary | ICD-10-CM | POA: Diagnosis not present

## 2012-03-22 DIAGNOSIS — K1370 Unspecified lesions of oral mucosa: Secondary | ICD-10-CM | POA: Diagnosis not present

## 2012-03-30 DIAGNOSIS — B370 Candidal stomatitis: Secondary | ICD-10-CM | POA: Diagnosis not present

## 2012-03-30 DIAGNOSIS — K1370 Unspecified lesions of oral mucosa: Secondary | ICD-10-CM | POA: Diagnosis not present

## 2012-04-06 DIAGNOSIS — M25539 Pain in unspecified wrist: Secondary | ICD-10-CM | POA: Diagnosis not present

## 2012-05-05 ENCOUNTER — Other Ambulatory Visit: Payer: Self-pay | Admitting: Radiation Oncology

## 2012-05-05 ENCOUNTER — Ambulatory Visit
Admission: RE | Admit: 2012-05-05 | Discharge: 2012-05-05 | Disposition: A | Discharge status: Home / Self Care | Payer: Medicare Other | Source: Ambulatory Visit | Attending: Radiation Oncology | Admitting: Radiation Oncology

## 2012-05-05 DIAGNOSIS — E782 Mixed hyperlipidemia: Secondary | ICD-10-CM | POA: Diagnosis not present

## 2012-05-05 DIAGNOSIS — C50919 Malignant neoplasm of unspecified site of unspecified female breast: Secondary | ICD-10-CM | POA: Diagnosis not present

## 2012-05-05 DIAGNOSIS — C50119 Malignant neoplasm of central portion of unspecified female breast: Secondary | ICD-10-CM

## 2012-05-05 DIAGNOSIS — Z0000 Encounter for general adult medical examination without abnormal findings: Secondary | ICD-10-CM | POA: Diagnosis not present

## 2012-05-05 DIAGNOSIS — F329 Major depressive disorder, single episode, unspecified: Secondary | ICD-10-CM | POA: Diagnosis not present

## 2012-05-05 DIAGNOSIS — E039 Hypothyroidism, unspecified: Secondary | ICD-10-CM | POA: Diagnosis not present

## 2012-05-05 DIAGNOSIS — Z1331 Encounter for screening for depression: Secondary | ICD-10-CM | POA: Diagnosis not present

## 2012-05-05 DIAGNOSIS — Z780 Asymptomatic menopausal state: Secondary | ICD-10-CM | POA: Diagnosis not present

## 2012-05-16 ENCOUNTER — Ambulatory Visit
Admission: RE | Admit: 2012-05-16 | Discharge: 2012-05-16 | Disposition: A | Discharge status: Home / Self Care | Payer: Medicare Other | Source: Ambulatory Visit | Attending: Radiation Oncology | Admitting: Radiation Oncology

## 2012-05-16 DIAGNOSIS — Z9289 Personal history of other medical treatment: Secondary | ICD-10-CM

## 2012-05-16 DIAGNOSIS — C50119 Malignant neoplasm of central portion of unspecified female breast: Secondary | ICD-10-CM

## 2012-05-16 DIAGNOSIS — R928 Other abnormal and inconclusive findings on diagnostic imaging of breast: Secondary | ICD-10-CM | POA: Diagnosis not present

## 2012-05-16 HISTORY — DX: Personal history of other medical treatment: Z92.89

## 2012-06-09 ENCOUNTER — Encounter: Payer: Self-pay | Admitting: Radiation Oncology

## 2012-06-15 ENCOUNTER — Encounter: Payer: Self-pay | Admitting: Radiation Oncology

## 2012-06-15 ENCOUNTER — Ambulatory Visit
Admission: RE | Admit: 2012-06-15 | Discharge: 2012-06-15 | Disposition: A | Discharge status: Home / Self Care | Payer: Medicare Other | Source: Ambulatory Visit | Attending: Radiation Oncology | Admitting: Radiation Oncology

## 2012-06-15 ENCOUNTER — Ambulatory Visit: Payer: Medicare Other | Admitting: Radiation Oncology

## 2012-06-15 VITALS — BP 144/78 | HR 88 | Temp 97.5°F | Wt 137.0 lb

## 2012-06-15 DIAGNOSIS — C50111 Malignant neoplasm of central portion of right female breast: Secondary | ICD-10-CM

## 2012-06-15 HISTORY — DX: Personal history of irradiation: Z92.3

## 2012-06-15 HISTORY — DX: Personal history of other medical treatment: Z92.89

## 2012-06-15 NOTE — Progress Notes (Addendum)
Samantha Weeks here for FU appt. S/P radiation in June of 2013.  She has no voiced complaints of pain and she states that she has fatigue, but she feels this is related to her again and not from her prior treatment.   She is not on any endocrine therapy.

## 2012-06-15 NOTE — Progress Notes (Signed)
   Department of Radiation Oncology  Phone:  [phone] Fax:        [phone]   Name: Samantha Weeks MRN: [id number]  DOB: 04/23/1940  Date: 06/15/2012  Follow Up Visit Note  Diagnosis: T1N0 right breast cancer  Summary and Interval since last radiation: year from 42.72 Sessa completed 08/04/11  Interval History: Samantha Weeks presents today for routine followup.  She is feeling well and doing well. She has joined a Administrator and is enjoying that a immensely. She has no breast related complaints. She had a negative mammogram on April 8. He declined antiestrogen therapy.  Allergies: No Known Allergies  Medications:  Current Outpatient Prescriptions  Medication Sig Dispense Refill  . calcium citrate (CALCITRATE - DOSED IN MG ELEMENTAL CALCIUM) 950 MG tablet Take 1 tablet by mouth daily.      . calcium-vitamin D (OSCAL WITH D) 500-200 MG-UNIT per tablet Take 2 tablets by mouth daily.      . fish oil-omega-3 fatty acids 1000 MG capsule Take 1 g by mouth daily.      Marland Kitchen levothyroxine (SYNTHROID, LEVOTHROID) 50 MCG tablet Take by mouth daily.       . Multiple Vitamin (MULTIVITAMIN) tablet Take 1 tablet by mouth daily.      . nortriptyline (PAMELOR) 50 MG capsule Take 100 mg by mouth daily.       No current facility-administered medications for this encounter.    Physical Exam:  Filed Vitals:   06/15/12 1307  BP: 144/78  Pulse: 88  Temp: 97.5 F (36.4 C)   she is a pleasant female in no distress sitting comfortably examining table. She is status post central lumpectomy on the right. She has palpable breast tissue in the upper outer quadrant. No palpable or visible signs of tumor recurrence. She has again palpable breast tissue in the upper outer quadrant of the left breast. No palpable axillary or supraclavicular adenopathy.  IMPRESSION: Samantha Weeks is a 72 y.o. female status post breast conservation with no evidence of disease  PLAN:  Patient looks great. She did have a recent appointment  for Dr. Ezzard Standing to schedule but I asked her to schedule that for 6 months and I will plan on seeing her back in one year. We discussed skin protection in the treated area.    Lurline Hare, MD

## 2012-06-28 DIAGNOSIS — M899 Disorder of bone, unspecified: Secondary | ICD-10-CM | POA: Diagnosis not present

## 2012-06-28 DIAGNOSIS — M949 Disorder of cartilage, unspecified: Secondary | ICD-10-CM | POA: Diagnosis not present

## 2012-06-28 DIAGNOSIS — N951 Menopausal and female climacteric states: Secondary | ICD-10-CM | POA: Diagnosis not present

## 2012-06-29 ENCOUNTER — Telehealth (INDEPENDENT_AMBULATORY_CARE_PROVIDER_SITE_OTHER): Payer: Self-pay

## 2012-06-29 NOTE — Telephone Encounter (Signed)
V/M  Appointment with Dr. Ezzard Standing 09/07/12@9 :45

## 2012-09-07 ENCOUNTER — Ambulatory Visit (INDEPENDENT_AMBULATORY_CARE_PROVIDER_SITE_OTHER): Payer: Medicare Other | Admitting: Surgery

## 2012-10-20 ENCOUNTER — Other Ambulatory Visit (HOSPITAL_BASED_OUTPATIENT_CLINIC_OR_DEPARTMENT_OTHER): Payer: Medicare Other | Admitting: Lab

## 2012-10-20 ENCOUNTER — Ambulatory Visit (HOSPITAL_BASED_OUTPATIENT_CLINIC_OR_DEPARTMENT_OTHER): Payer: Medicare Other | Admitting: Oncology

## 2012-10-20 ENCOUNTER — Telehealth: Payer: Self-pay | Admitting: Oncology

## 2012-10-20 ENCOUNTER — Encounter: Payer: Self-pay | Admitting: Oncology

## 2012-10-20 VITALS — BP 135/79 | HR 94 | Temp 98.1°F | Resp 18 | Ht 62.0 in | Wt 132.2 lb

## 2012-10-20 DIAGNOSIS — C50119 Malignant neoplasm of central portion of unspecified female breast: Secondary | ICD-10-CM

## 2012-10-20 DIAGNOSIS — C50111 Malignant neoplasm of central portion of right female breast: Secondary | ICD-10-CM

## 2012-10-20 LAB — CBC WITH DIFFERENTIAL/PLATELET
BASO%: 1 % (ref 0.0–2.0)
EOS%: 3.8 % (ref 0.0–7.0)
HCT: 39 % (ref 34.8–46.6)
MCH: 29.6 pg (ref 25.1–34.0)
MCHC: 33.6 g/dL (ref 31.5–36.0)
NEUT%: 61.2 % (ref 38.4–76.8)
RBC: 4.44 10*6/uL (ref 3.70–5.45)
WBC: 5.8 10*3/uL (ref 3.9–10.3)
lymph#: 1.6 10*3/uL (ref 0.9–3.3)

## 2012-10-20 LAB — COMPREHENSIVE METABOLIC PANEL (CC13)
ALT: 19 U/L (ref 0–55)
AST: 23 U/L (ref 5–34)
Calcium: 9.5 mg/dL (ref 8.4–10.4)
Chloride: 106 mEq/L (ref 98–109)
Creatinine: 0.8 mg/dL (ref 0.6–1.1)
Sodium: 143 mEq/L (ref 136–145)
Total Bilirubin: 0.27 mg/dL (ref 0.20–1.20)
Total Protein: 7.2 g/dL (ref 6.4–8.3)

## 2012-10-20 NOTE — Patient Instructions (Addendum)
Doing well   Continue observation  We will see you back in 1 year

## 2012-10-29 NOTE — Progress Notes (Signed)
OFFICE PROGRESS NOTE  CC  HUSAIN,KARRAR, MD [address] [place] [address] Dr. Ovidio Kin Dr. Lurline Hare   DIAGNOSIS: 72 year old with Stage I IDC s/p central lumpectomy on 06/15/11  PRIOR THERAPY: 1. S/P central lumpectomy for a 1.3 cm IDC, ER+ 0/1 lymph node positive for mets  #2 patient is now status post radiation therapy to the right breast completed June 2013.  #3 patient was recommended antiestrogen therapy but she has now declined.   CURRENT THERAPY:observation patient declined any further adjuvant treatment.  INTERVAL HISTORY: Samantha Weeks 72 y.o. female returns for follow up visit Overall she's doing well she is without any complaints. She denies any nausea vomiting fevers chills night sweats headaches she has no shortness of breath or chest pains or palpitations. She still remains very adamant regarding any further adjuvant treatment especially with an antiestrogen therapy. She does not want it. But she does want to be seen by me on a periodic basis. Remainder of the 10 point review of systems is negative.  MEDICAL HISTORY: Past Medical History  Diagnosis Date  . Hx of colonoscopy   . Hx of bone density study   . Depression   . Breast cancer     RT BREAST  . Hypothyroidism   . History of radiation therapy 07/14/11-08/04/11    R breast,42.72Gy/3fx a@2 .67 Gy per fx  . H/O mammogram 05/16/12    B/L breasts digital MM    ALLERGIES:  has No Known Allergies.  MEDICATIONS:  Current Outpatient Prescriptions  Medication Sig Dispense Refill  . calcium citrate (CALCITRATE - DOSED IN MG ELEMENTAL CALCIUM) 950 MG tablet Take 1 tablet by mouth daily.      . calcium-vitamin D (OSCAL WITH D) 500-200 MG-UNIT per tablet Take 2 tablets by mouth daily.      . fish oil-omega-3 fatty acids 1000 MG capsule Take 1 g by mouth daily.      Marland Kitchen levothyroxine (SYNTHROID, LEVOTHROID) 50 MCG tablet Take by mouth daily.       Marland Kitchen MELATONIN PO Take by mouth.      .  Multiple Vitamin (MULTIVITAMIN) tablet Take 1 tablet by mouth daily.      . nortriptyline (PAMELOR) 50 MG capsule Take 100 mg by mouth daily.       No current facility-administered medications for this visit.    SURGICAL HISTORY:  Past Surgical History  Procedure Laterality Date  . Cataract extraction    . Tonsillectomy    . Eye surgery      BIL CATARACT AND RT RETINAL SX  . Breast surgery  06/15/2011    Right Br lumpectomy  . Abdominal hysterectomy      Complete    REVIEW OF SYSTEMS:  A comprehensive review of systems was negative.   PHYSICAL EXAMINATION: General appearance: alert, cooperative and appears stated age Resp: clear to auscultation bilaterally and normal percussion bilaterally Back: symmetric, no curvature. ROM normal. No CVA tenderness. Cardio: regular rate and rhythm, S1, S2 normal, no murmur, click, rub or gallop GI: soft, non-tender; bowel sounds normal; no masses,  no organomegaly Extremities: extremities normal, atraumatic, no cyanosis or edema Neurologic: Grossly normal Right breast incisional scar is well healed there is no nodularity no evidence of recurrent disease locally. ECOG PERFORMANCE STATUS: 0 - Asymptomatic  Blood pressure 135/79, pulse 94, temperature 98.1 F (36.7 C), temperature source Oral, resp. rate 18, height 5\' 2"  (1.575 m), weight 132 lb 3.2 oz (59.966 kg).  LABORATORY DATA: Lab Results  Component Value Date   WBC 5.8 10/20/2012   HGB 13.1 10/20/2012   HCT 39.0 10/20/2012   MCV 87.8 10/20/2012   PLT 328 10/20/2012      Chemistry      Component Value Date/Time   NA 143 10/20/2012 1350   NA 141 06/02/2011 0814   K 4.2 10/20/2012 1350   K 3.7 06/02/2011 0814   CL 106 10/22/2011 1410   CL 103 06/02/2011 0814   CO2 29 10/20/2012 1350   CO2 29 06/02/2011 0814   BUN 14.7 10/20/2012 1350   BUN 17 06/02/2011 0814   CREATININE 0.8 10/20/2012 1350   CREATININE 0.89 06/02/2011 0814      Component Value Date/Time   CALCIUM 9.5 10/20/2012 1350    CALCIUM 9.5 06/02/2011 0814   ALKPHOS 83 10/20/2012 1350   ALKPHOS 79 06/02/2011 0814   AST 23 10/20/2012 1350   AST 25 06/02/2011 0814   ALT 19 10/20/2012 1350   ALT 20 06/02/2011 0814   BILITOT 0.27 10/20/2012 1350   BILITOT 0.3 06/02/2011 0814       RADIOGRAPHIC STUDIES:  Nm Sentinel Node Inj-no Rpt (breast)  06/15/2011  CLINICAL DATA: right breast cancer.   Sulfur colloid was injected intradermally by the nuclear medicine  technologist for breast cancer sentinel node localization.     [place] Wire Localization Right  06/15/2011  *RADIOLOGY REPORT*  Clinical Data:  Right breast cancer  RIGHT BREAST NEEDLE LOCALIZATION USING ULTRASOUND GUIDANCE AND SPECIMEN RADIOGRAPH  Patient presents for needle localization prior to surgical excision. The patient and I discussed the procedure of needle localization including benefits and alternatives. We discussed the high likelihood of a successful procedure. We discussed the risks of the procedure, including infection, bleeding, tissue injury and further surgery. Informed written consent was given.  Using ultrasound guidance, sterile technique, 2% lidocaine and a a 7 cm InRad Ultrawire, the mass in the right subareolar region was localized using a mediolateral approach. Films were labeled and sent with the patient to surgery.  She tolerated the procedure well.  Specimen radiograph is performed at [facility] and confirms the clip, wire and mass to be present in the tissue sample.  The specimen is marked for pathology.  IMPRESSION: Needle localization right breast.  No apparent complications.  Original Report Authenticated By: Daryl Eastern, M.D.   Mm Breast Surgical Specimen  06/15/2011  *RADIOLOGY REPORT*  Clinical Data:  Right breast cancer  RIGHT BREAST NEEDLE LOCALIZATION USING ULTRASOUND GUIDANCE AND SPECIMEN RADIOGRAPH  Patient presents for needle localization prior to surgical excision. The patient and I discussed the procedure of needle  localization including benefits and alternatives. We discussed the high likelihood of a successful procedure. We discussed the risks of the procedure, including infection, bleeding, tissue injury and further surgery. Informed written consent was given.  Using ultrasound guidance, sterile technique, 2% lidocaine and a a 7 cm InRad Ultrawire, the mass in the right subareolar region was localized using a mediolateral approach. Films were labeled and sent with the patient to surgery.  She tolerated the procedure well.  Specimen radiograph is performed at [facility] and confirms the clip, wire and mass to be present in the tissue sample.  The specimen is marked for pathology.  IMPRESSION: Needle localization right breast.  No apparent complications.  Original Report Authenticated By: Daryl Eastern, M.D.   PATHOLOGY  ADDITIONAL INFORMATION: 2. CHROMOGENIC IN-SITU HYBRIDIZATION Interpretation HER-2/NEU BY  CISH - NO AMPLIFICATION OF HER-2 DETECTED. THE RATIO OF HER-2: CEP 17 SIGNALS WAS 1.19. Reference range: Ratio: HER2:CEP17 < 1.8 - gene amplification not observed Ratio: HER2:CEP 17 1.8-2.2 - equivocal result Ratio: HER2:CEP17 > 2.2 - gene amplification observed Abigail Miyamoto MD Pathologist, Electronic Signature ( Signed 06/22/2011) FINAL DIAGNOSIS Diagnosis 1. Lymph node, sentinel, biopsy, Right axilla - ONE BENIGN LYMPH NODE (0/1). 2. Breast, lumpectomy, Right - INVASIVE DUCTAL CARCINOMA, 1.3 CM. - MSBR GRADE I. - MARGINS NOT INVOLVED. Microscopic Comment 2. BREAST, INVASIVE TUMOR, WITH LYMPH NODE SAMPLING Specimen, including laterality: Right breast Procedure: Needle localized lumpectomy Grade: I 1 of 3 FINAL for SULAY, BRYMER ([id number]) Microscopic Comment(continued) Tubule formation: 1 Nuclear pleomorphism: 2 Mitotic: 1 Tumor size (gross measurement): 1.3 cm Margins: Free of tumor Invasive, distance to closest margin: 0.9 cm from anterior resection  margin In-situ, distance to closest margin: N/A If margin positive, focally or broadly: N/A Lymphovascular invasion: Not identified Ductal carcinoma in situ: No Grade: N/A Extensive intraductal component: N/A Lobular neoplasia: No Treatment effect: No If present, treatment effect in breast tissue, lymph nodes or both: N/A Extent of tumor: Skin: Free of tumor Nipple: Tumor extends into the subareolar connective tissue Skeletal muscle: N/A Lymph nodes: # examined: 1 Lymph nodes with metastasis: 0 Isolated tumor cells (< 0.2 mm): 0 Micrometastasis: (> 0.2 mm and < 2.0 mm): 0 Macrometastasis: (> 2.0 mm): 0 Extracapsular extension: N/A Breast prognostic profile: Case [id number] Estrogen receptor: 100%, positive Progesterone receptor: 100%, positive Her 2 neu: No amplification; ratio is 1.14; will be repeated on the current specimen. 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 S/P lumpectomy with sentinel lymph node biopsy. Patient has now completed radiation therapy overall she tolerated it well. She is declining any further adjuvant treatment with an antiestrogen therapy agents such as aromatase inhibitors or tamoxifen. She feels that the risks of treatment would be worse than the benefits and therefore she does not want to deal with it.   PLAN:   Patient will be seen on a periodic basis. She has agreed to be seen by me once a year. Of course should we see me sooner if need arises.  All questions were answered. The patient knows to call the clinic with any problems, questions or concerns. We can certainly see the patient much sooner if necessary.  I spent 15 minutes counseling the patient face to face. The total time  spent in the appointment was 30 minutes.    Drue Second, MD Medical/Oncology [facility] [phone] (beeper) [phone] (Office)

## 2013-01-12 ENCOUNTER — Ambulatory Visit (INDEPENDENT_AMBULATORY_CARE_PROVIDER_SITE_OTHER): Payer: Medicare Other | Admitting: Surgery

## 2013-01-12 ENCOUNTER — Encounter (INDEPENDENT_AMBULATORY_CARE_PROVIDER_SITE_OTHER): Payer: Self-pay | Admitting: Surgery

## 2013-01-12 VITALS — BP 122/68 | HR 80 | Temp 97.6°F | Resp 14 | Ht 62.0 in | Wt 134.0 lb

## 2013-01-12 DIAGNOSIS — C50119 Malignant neoplasm of central portion of unspecified female breast: Secondary | ICD-10-CM

## 2013-01-12 DIAGNOSIS — C50111 Malignant neoplasm of central portion of right female breast: Secondary | ICD-10-CM

## 2013-01-12 NOTE — Progress Notes (Signed)
Re:   Samantha Weeks DOB:   08-20-1940 MRN:   [id number]  [facility]  ASSESSMENT AND PLAN: 1.  Right breast cancer, Central, behind the nipple. T1c, N0.  Central lumpectomy - 06/15/2011  1.3 cm IDC.  0/1 node  ER/PR - 100/100, Ki-67 - 12%, Her2Neu - neg.  Seeing Drs. Welton Flakes and [place].  Her radiation was completed 08/04/2011.  She decided against antiestrogen therapy.  She is disease free.  I will see her back in 1 year.  I will alternate with her seeing Dr. Welton Flakes, so she is seeing one of [place] every 6 months.  2.  Thyroid replacement.  HISTORY OF PRESENT ILLNESS: Samantha Weeks is a 72 y.o. (DOB: Jun 18, 1940)  white female whose primary care physician is Dr. Burney Gauze and comes for follow up for right breast cancer.  She had a right breast central lumpectomy and right axillary SLNBx on 06/15/2011.    She is doing well with no new concern or abnormality.  She is still working and hopes to work until age 72.   Past Medical History  Diagnosis Date  . Hx of colonoscopy   . Hx of bone density study   . Depression   . Breast cancer     RT BREAST  . Hypothyroidism   . History of radiation therapy 07/14/11-08/04/11    R breast,42.72Gy/73fx a@2 .67 Gy per fx  . H/O mammogram 05/16/12    B/L breasts digital MM     Current Outpatient Prescriptions  Medication Sig Dispense Refill  . calcium citrate (CALCITRATE - DOSED IN MG ELEMENTAL CALCIUM) 950 MG tablet Take 1 tablet by mouth daily.      . calcium-vitamin D (OSCAL WITH D) 500-200 MG-UNIT per tablet Take 2 tablets by mouth daily.      . fish oil-omega-3 fatty acids 1000 MG capsule Take 1 g by mouth daily.      Marland Kitchen levothyroxine (SYNTHROID, LEVOTHROID) 50 MCG tablet Take by mouth daily.       Marland Kitchen MELATONIN PO Take by mouth.      . Multiple Vitamin (MULTIVITAMIN) tablet Take 1 tablet by mouth daily.      . nortriptyline (PAMELOR) 50 MG capsule Take 100 mg by mouth daily.       No current facility-administered medications for this visit.     No Known  Allergies  REVIEW OF SYSTEMS: Endocrine:  No diabetes. On thyroid replacement x 3 years. Gastrointestinal:  No history of stomach disease.  No history of liver disease.  No history of gall bladder disease.  No history of pancreas disease.  No history of colon disease.  Colonoscopy in 2007.  Unknown gastroenterologist. Urologic:  No history of kidney stones.  No history of bladder infections. GYN:  Hysterectomy in 1980's for bleeding.  SOCIAL and FAMILY HISTORY: Divorced.  Lives by self. Samantha Weeks, a friend and breast cancer patient of mine, accompanied her today. No children. 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  PHYSICAL EXAM: BP 122/68  Pulse 80  Temp(Src) 97.6 F (36.4 C) (Temporal)  Resp 14  Ht 5\' 2"  (1.575 m)  Wt 134 lb (60.782 kg)  BMI 24.50 kg/m2  HEENT:  Pupils equal.  Dentition good. NECK:  Supple.  No thyroid mass. LYMPH NODES:  No cervical, supraclavicular, or axillary adenopathy. BREASTS -  RIGHT: She has a linear scar where her nipple was.  She has some thickened tissue a the center of her scar, but no specific mass.   LEFT:  No palpable mass or nodule.  No nipple discharge. UPPER EXTREMITIES:  No evidence of lymphedema.  DATA REVIEWED: Mammogram ([facility]) - 05/16/2012 - BI-RADS 1  Ovidio Kin, MD,  [facility] [address] [place]  [address]   [place], [place]    [address] Phone:  [phone] FAX:  [phone]

## 2013-04-09 ENCOUNTER — Other Ambulatory Visit: Payer: Self-pay | Admitting: Internal Medicine

## 2013-04-09 DIAGNOSIS — Z853 Personal history of malignant neoplasm of breast: Secondary | ICD-10-CM

## 2013-04-09 DIAGNOSIS — Z9889 Other specified postprocedural states: Secondary | ICD-10-CM

## 2013-05-08 DIAGNOSIS — E039 Hypothyroidism, unspecified: Secondary | ICD-10-CM | POA: Diagnosis not present

## 2013-05-08 DIAGNOSIS — E785 Hyperlipidemia, unspecified: Secondary | ICD-10-CM | POA: Diagnosis not present

## 2013-05-08 DIAGNOSIS — C50919 Malignant neoplasm of unspecified site of unspecified female breast: Secondary | ICD-10-CM | POA: Diagnosis not present

## 2013-05-08 DIAGNOSIS — Z0000 Encounter for general adult medical examination without abnormal findings: Secondary | ICD-10-CM | POA: Diagnosis not present

## 2013-05-08 DIAGNOSIS — Z23 Encounter for immunization: Secondary | ICD-10-CM | POA: Diagnosis not present

## 2013-05-08 DIAGNOSIS — F329 Major depressive disorder, single episode, unspecified: Secondary | ICD-10-CM | POA: Diagnosis not present

## 2013-05-08 DIAGNOSIS — Z1331 Encounter for screening for depression: Secondary | ICD-10-CM | POA: Diagnosis not present

## 2013-05-17 ENCOUNTER — Ambulatory Visit
Admission: RE | Admit: 2013-05-17 | Discharge: 2013-05-17 | Disposition: A | Discharge status: Home / Self Care | Payer: Medicare Other | Source: Ambulatory Visit | Attending: Internal Medicine | Admitting: Internal Medicine

## 2013-05-17 DIAGNOSIS — Z853 Personal history of malignant neoplasm of breast: Secondary | ICD-10-CM

## 2013-05-17 DIAGNOSIS — Z9889 Other specified postprocedural states: Secondary | ICD-10-CM

## 2013-05-17 DIAGNOSIS — N6489 Other specified disorders of breast: Secondary | ICD-10-CM | POA: Diagnosis not present

## 2013-06-14 ENCOUNTER — Ambulatory Visit
Admission: RE | Admit: 2013-06-14 | Discharge: 2013-06-14 | Disposition: A | Discharge status: Home / Self Care | Payer: Medicare Other | Source: Ambulatory Visit | Attending: Radiation Oncology | Admitting: Radiation Oncology

## 2013-06-14 VITALS — BP 141/76 | HR 90 | Temp 98.0°F | Resp 20 | Ht 62.0 in | Wt 135.0 lb

## 2013-06-14 DIAGNOSIS — C50119 Malignant neoplasm of central portion of unspecified female breast: Secondary | ICD-10-CM

## 2013-06-14 HISTORY — DX: Reserved for inherently not codable concepts without codable children: IMO0001

## 2013-06-14 HISTORY — DX: Reserved for concepts with insufficient information to code with codable children: IMO0002

## 2013-06-14 NOTE — Progress Notes (Signed)
Follow up rad txs right breast  07/14/11-08/04/11, had last b/l  mammogram 05/17/13, no c/o pain or discomfort, decided not to take anti estrogen , appetite good, energy  Better , no pain 1:11 PM

## 2013-06-14 NOTE — Progress Notes (Signed)
   Department of Radiation Oncology  Phone:  [phone] Fax:        [phone]   Name: Samantha Weeks MRN: [id number]  DOB: 10/03/1940  Date: 06/14/2013  Follow Up Visit Note  Diagnosis: T1N0 right breast cancer  Summary and Interval since last radiation: 2 years from  42.72 Vosler completed 08/04/11  Interval History: Samantha Weeks presents today for routine followup.  She is feeling well and doing well. She is planning to retire in Dec 2016.  She has no breast related complaints. She had a negative mammogram in April. She declined antiestrogen therapy. She is seeing Dr. Lucia Gaskins in December.    Allergies: No Known Allergies  Medications:  Current Outpatient Prescriptions  Medication Sig Dispense Refill  . calcium citrate (CALCITRATE - DOSED IN MG ELEMENTAL CALCIUM) 950 MG tablet Take 1 tablet by mouth daily.      Marland Kitchen levothyroxine (SYNTHROID, LEVOTHROID) 50 MCG tablet Take by mouth daily.       Marland Kitchen MELATONIN PO Take 1 tablet by mouth at bedtime.       . Multiple Vitamin (MULTIVITAMIN) tablet Take 1 tablet by mouth daily.      . nortriptyline (PAMELOR) 50 MG capsule Take 100 mg by mouth daily.      . fish oil-omega-3 fatty acids 1000 MG capsule Take 1 g by mouth daily.       No current facility-administered medications for this encounter.    Physical Exam:  Filed Vitals:   06/14/13 1307  BP: 141/76  Pulse: 90  Temp: 98 F (36.7 C)  Resp: 20   she is a pleasant female in no distress sitting comfortably examining table. She is status post central lumpectomy on the right. She has palpable breast tissue in the upper outer quadrant. No palpable or visible signs of tumor recurrence. She has again palpable breast tissue in the upper outer quadrant of the left breast. No palpable axillary or supraclavicular adenopathy.  IMPRESSION: Samantha Weeks is a 73 y.o. female status post breast conservation with no evidence of disease  PLAN:  Patient looks great.follow up in 1 year. Can cancel f/u with medical  oncology and pt is ok with this. Follow up with surgery in 6 months.     Thea Silversmith, MD

## 2013-06-20 ENCOUNTER — Encounter: Payer: Self-pay | Admitting: *Deleted

## 2013-06-20 NOTE — Progress Notes (Signed)
Per Dr. Pablo Ledger, appt to see Dr. Humphrey Rolls in Sept cancelled.  Pt is not received AI therapy and didn't see a need to continue seeing Dr. Humphrey Rolls.  Drs. Pablo Ledger and Zella Richer will continue her survivorship care.

## 2013-10-19 ENCOUNTER — Other Ambulatory Visit: Payer: Medicare Other

## 2013-10-19 ENCOUNTER — Ambulatory Visit: Payer: Medicare Other | Admitting: Oncology

## 2013-12-12 DIAGNOSIS — R05 Cough: Secondary | ICD-10-CM | POA: Diagnosis not present

## 2013-12-18 DIAGNOSIS — H04123 Dry eye syndrome of bilateral lacrimal glands: Secondary | ICD-10-CM | POA: Diagnosis not present

## 2014-04-11 DIAGNOSIS — C50911 Malignant neoplasm of unspecified site of right female breast: Secondary | ICD-10-CM | POA: Diagnosis not present

## 2014-04-15 ENCOUNTER — Other Ambulatory Visit: Payer: Self-pay | Admitting: Radiation Oncology

## 2014-04-15 DIAGNOSIS — Z853 Personal history of malignant neoplasm of breast: Secondary | ICD-10-CM

## 2014-05-13 DIAGNOSIS — E039 Hypothyroidism, unspecified: Secondary | ICD-10-CM | POA: Diagnosis not present

## 2014-05-13 DIAGNOSIS — C50911 Malignant neoplasm of unspecified site of right female breast: Secondary | ICD-10-CM | POA: Diagnosis not present

## 2014-05-13 DIAGNOSIS — E782 Mixed hyperlipidemia: Secondary | ICD-10-CM | POA: Diagnosis not present

## 2014-05-13 DIAGNOSIS — Z0000 Encounter for general adult medical examination without abnormal findings: Secondary | ICD-10-CM | POA: Diagnosis not present

## 2014-05-13 DIAGNOSIS — Z1389 Encounter for screening for other disorder: Secondary | ICD-10-CM | POA: Diagnosis not present

## 2014-05-13 DIAGNOSIS — H9190 Unspecified hearing loss, unspecified ear: Secondary | ICD-10-CM | POA: Diagnosis not present

## 2014-05-13 DIAGNOSIS — G43909 Migraine, unspecified, not intractable, without status migrainosus: Secondary | ICD-10-CM | POA: Diagnosis not present

## 2014-05-13 DIAGNOSIS — F324 Major depressive disorder, single episode, in partial remission: Secondary | ICD-10-CM | POA: Diagnosis not present

## 2014-05-20 ENCOUNTER — Ambulatory Visit
Admission: RE | Admit: 2014-05-20 | Discharge: 2014-05-20 | Disposition: A | Discharge status: Home / Self Care | Payer: Medicare Other | Source: Ambulatory Visit | Attending: Radiation Oncology | Admitting: Radiation Oncology

## 2014-05-20 DIAGNOSIS — N6459 Other signs and symptoms in breast: Secondary | ICD-10-CM | POA: Diagnosis not present

## 2014-05-20 DIAGNOSIS — Z853 Personal history of malignant neoplasm of breast: Secondary | ICD-10-CM

## 2014-06-13 ENCOUNTER — Ambulatory Visit: Payer: Medicare Other | Admitting: Radiation Oncology

## 2014-09-17 ENCOUNTER — Ambulatory Visit
Admission: RE | Admit: 2014-09-17 | Discharge: 2014-09-17 | Disposition: A | Discharge status: Home / Self Care | Payer: Medicare Other | Source: Ambulatory Visit | Attending: Internal Medicine | Admitting: Internal Medicine

## 2014-09-17 ENCOUNTER — Other Ambulatory Visit: Payer: Self-pay | Admitting: Internal Medicine

## 2014-09-17 DIAGNOSIS — Z1389 Encounter for screening for other disorder: Secondary | ICD-10-CM | POA: Diagnosis not present

## 2014-09-17 DIAGNOSIS — M19071 Primary osteoarthritis, right ankle and foot: Secondary | ICD-10-CM | POA: Diagnosis not present

## 2014-09-17 DIAGNOSIS — M79671 Pain in right foot: Secondary | ICD-10-CM | POA: Diagnosis not present

## 2014-09-17 DIAGNOSIS — M2011 Hallux valgus (acquired), right foot: Secondary | ICD-10-CM | POA: Diagnosis not present

## 2014-11-12 DIAGNOSIS — Z23 Encounter for immunization: Secondary | ICD-10-CM | POA: Diagnosis not present

## 2014-11-14 ENCOUNTER — Ambulatory Visit
Admission: RE | Admit: 2014-11-14 | Discharge: 2014-11-14 | Disposition: A | Discharge status: Home / Self Care | Payer: Medicare Other | Source: Ambulatory Visit | Attending: Radiation Oncology | Admitting: Radiation Oncology

## 2014-11-14 VITALS — BP 137/71 | HR 84 | Temp 98.0°F | Wt 134.0 lb

## 2014-11-14 DIAGNOSIS — C50111 Malignant neoplasm of central portion of right female breast: Secondary | ICD-10-CM

## 2014-11-14 NOTE — Progress Notes (Signed)
   Department of Radiation Oncology  Phone:  [phone] Fax:        [phone]   Name: Samantha Weeks MRN: [id number]  DOB: [birth year]  Date: 11/14/2014  Follow Up Visit Note  Diagnosis: T1N0 right breast cancer  Summary and Interval since last radiation: 3 years. 07/14/2011-08/04/2011 Site/dose: Right breast / 42.72 Rackers @ 2.67 Pearline Cables per fraction x 16 fractions  Interval History: Samantha Weeks presents today for routine followup. Denies pain. No recent hospitalizations or health concerns. A mammogram was performed 05/20/14 and negative for malignancy. She has no breast related complaints.  She follows with her PCP, Dr. Deforest Hoyles. 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 She has follow up scheduled with Dr. Zella Richer in April.   Allergies: No Known Allergies  Medications:  Current Outpatient Prescriptions  Medication Sig Dispense Refill  . calcium citrate (CALCITRATE - DOSED IN MG ELEMENTAL CALCIUM) 950 MG tablet Take 1 tablet by mouth daily.    . fish oil-omega-3 fatty acids 1000 MG capsule Take 1 g by mouth daily.    Marland Kitchen levothyroxine (SYNTHROID, LEVOTHROID) 50 MCG tablet Take by mouth daily.     Marland Kitchen MELATONIN PO Take 1 tablet by mouth at bedtime.     . Multiple Vitamin (MULTIVITAMIN) tablet Take 1 tablet by mouth daily.    . nortriptyline (PAMELOR) 50 MG capsule Take 100 mg by mouth daily.     No current facility-administered medications for this encounter.    Physical Exam:  Filed Vitals:   11/14/14 1257  BP: 137/71  Pulse: 84  Temp: 98 F (36.7 C)  She is a pleasant female in no distress sitting comfortably on the examination table. She is absent of the right nipple. Breast tissue is palpable in the upper outer quadrant of the right breast. Her tumor cavity is without palpable signs of recurrence. No palpable abnormalities in the left breast. No palpable cervical, supraclavicular, or axillary adenopathy bilaterally.  IMPRESSION: Samantha Weeks is a 74 y.o.  female status post breast conservation with no evidence of disease.  PLAN: She will be scheduled for Survivorship and a mammogram in April 2017. She can see me on a PRN basis and I will continue to be available if she has any questions or concerns.  This document serves as a record of services personally performed by Thea Silversmith, MD. It was created on her behalf by Darcus Austin, a trained medical scribe. The creation of this record is based on the scribe's personal observations and the provider's statements to them. This document has been checked and approved by the attending provider.   Thea Silversmith, MD

## 2014-11-15 ENCOUNTER — Telehealth: Payer: Self-pay | Admitting: *Deleted

## 2014-11-15 NOTE — Telephone Encounter (Signed)
Called patient to inform of appt. For survivorship appt. For 11-28-14 @ 11 am and her mammogram on 05-21-15 @ 12:20 pm @ [facility], spoke with patient and she is aware of these appts.

## 2014-11-15 NOTE — Telephone Encounter (Signed)
CALLED PATIENT TO INFORM OF SURVIVORSHIP APPT. [place] ON 11-26-14 @ 11 AM, SPOKE WITH PATIENT AND SHE IS AWARE OF THIS APPT.

## 2014-11-19 ENCOUNTER — Telehealth: Payer: Self-pay | Admitting: *Deleted

## 2014-11-19 NOTE — Telephone Encounter (Signed)
CALLED PATIENT TO INFORM OF SURVIVORSHIP APPT. BEING CHANGED FROM 11-28-14 TO 05-23-15 @ 1 PM WITH HEATHER MACKEY, LVM FOR A RETURN CALL

## 2014-11-26 ENCOUNTER — Encounter: Payer: Medicare Other | Admitting: Nurse Practitioner

## 2014-11-28 ENCOUNTER — Encounter: Payer: Medicare Other | Admitting: Nurse Practitioner

## 2015-01-07 DIAGNOSIS — Z961 Presence of intraocular lens: Secondary | ICD-10-CM | POA: Diagnosis not present

## 2015-05-16 DIAGNOSIS — G43909 Migraine, unspecified, not intractable, without status migrainosus: Secondary | ICD-10-CM | POA: Diagnosis not present

## 2015-05-16 DIAGNOSIS — E7800 Pure hypercholesterolemia, unspecified: Secondary | ICD-10-CM | POA: Diagnosis not present

## 2015-05-16 DIAGNOSIS — Z0000 Encounter for general adult medical examination without abnormal findings: Secondary | ICD-10-CM | POA: Diagnosis not present

## 2015-05-16 DIAGNOSIS — E039 Hypothyroidism, unspecified: Secondary | ICD-10-CM | POA: Diagnosis not present

## 2015-05-16 DIAGNOSIS — H9190 Unspecified hearing loss, unspecified ear: Secondary | ICD-10-CM | POA: Diagnosis not present

## 2015-05-16 DIAGNOSIS — C50911 Malignant neoplasm of unspecified site of right female breast: Secondary | ICD-10-CM | POA: Diagnosis not present

## 2015-05-16 DIAGNOSIS — F324 Major depressive disorder, single episode, in partial remission: Secondary | ICD-10-CM | POA: Diagnosis not present

## 2015-05-16 DIAGNOSIS — Z1389 Encounter for screening for other disorder: Secondary | ICD-10-CM | POA: Diagnosis not present

## 2015-05-21 ENCOUNTER — Other Ambulatory Visit: Payer: Self-pay | Admitting: Radiation Oncology

## 2015-05-21 ENCOUNTER — Ambulatory Visit
Admission: RE | Admit: 2015-05-21 | Discharge: 2015-05-21 | Disposition: A | Discharge status: Home / Self Care | Payer: Medicare Other | Source: Ambulatory Visit | Attending: Radiation Oncology | Admitting: Radiation Oncology

## 2015-05-21 DIAGNOSIS — C50111 Malignant neoplasm of central portion of right female breast: Secondary | ICD-10-CM

## 2015-05-21 DIAGNOSIS — R928 Other abnormal and inconclusive findings on diagnostic imaging of breast: Secondary | ICD-10-CM | POA: Diagnosis not present

## 2015-05-23 ENCOUNTER — Encounter: Payer: Self-pay | Admitting: Nurse Practitioner

## 2015-05-23 ENCOUNTER — Telehealth: Payer: Self-pay | Admitting: Nurse Practitioner

## 2015-05-23 ENCOUNTER — Encounter: Payer: Medicare Other | Admitting: Nurse Practitioner

## 2015-05-23 ENCOUNTER — Ambulatory Visit (HOSPITAL_BASED_OUTPATIENT_CLINIC_OR_DEPARTMENT_OTHER): Payer: Medicare Other | Admitting: Nurse Practitioner

## 2015-05-23 VITALS — BP 151/70 | HR 84 | Temp 98.1°F | Resp 18 | Ht 62.0 in | Wt 134.2 lb

## 2015-05-23 DIAGNOSIS — Z853 Personal history of malignant neoplasm of breast: Secondary | ICD-10-CM

## 2015-05-23 DIAGNOSIS — C50111 Malignant neoplasm of central portion of right female breast: Secondary | ICD-10-CM

## 2015-05-23 NOTE — Telephone Encounter (Signed)
Gave and printed appt sched and avs fo rpt for OCT °

## 2015-05-23 NOTE — Progress Notes (Signed)
CLINIC:  Cancer Survivorship   REASON FOR VISIT:  Routine follow-up post-treatment for history of breast cancer.  BRIEF ONCOLOGIC HISTORY:    Cancer of central portion of female breast, right.   05/24/2011 Mammogram 10 mm irregular hypoechoic mass located within the subareolar portion of the right breast worrisome for possible invasive mammary carcinoma.   05/26/2011 Initial Biopsy Right breast core needle bx: Invasive ductal carcinoma, grade 1, ER+ (100%), PR+ (100%), HER2/neu negative (ratio 1.14), Ki67 12%    05/26/2011 Clinical Stage Stage IA: T1b N0   06/15/2011 Definitive Surgery Right central lumpectomy: Invasive ductal carcinoma, 1.3 cm, ER+ (100%), PR+ (100%), HER2/neu negative (ratio 1.19), Ki67 12%,  0/1 LN, negative margins   06/15/2011 Pathologic Stage Stage IA: pT1c pN0   07/14/2011 - 08/04/2011 Radiation Therapy Adjuvant RT: Right breast 42.72 Gy over 16 fractions    Anti-estrogen oral therapy pt declined    INTERVAL HISTORY:  Ms. Rundquist presents to the [facility] today for ongoing follow up regarding her history of breast cancer. Overall, Ms. Gautney reports feeling doing well since her last visit with Dr. Pablo Ledger in October 2016. She denies any change within her breast and her last mammogram was earlier this week and unremarkable.  Specifically, she denies any mass, lesion, nipple discharge or skin changes. She denies any headache, cough, shortness of breath, or bone pain. She reports a good appetite and denies any weight loss.  She continues on a cocoa supplement study through the [facility] and [facility] and is tolerating this well.  Of note, her sister was diagnosed with a second breast cancer with this being triple negative.  REVIEW OF SYSTEMS:  General: Denies fever, chills, unintentional weight loss, or generalized fatigue.  HEENT: Mild visual change with her last eye exam.  Denies visual changes, hearing loss, mouth sores, or difficulty swallowing. Cardiac: Denies  palpitations and lower extremity edema.  Respiratory: Denies wheeze or dyspnea on exertion.  Breast: As above.  GI: Denies abdominal pain, constipation, diarrhea, nausea, or vomiting.  GU: Denies dysuria, hematuria, vaginal bleeding, vaginal discharge, or vaginal dryness.  Musculoskeletal: Denies joint or bone pain.  Neuro: Denies recent fall or numbness / tingling in her extremities.  Skin: Denies rash, pruritis, or open wounds.  Psych: Denies depression, anxiety, insomnia, or memory loss.   A 14-point review of systems was completed and was negative, except as noted above.   ONCOLOGY TREATMENT TEAM:  1. Surgeon:  Dr. Lucia Gaskins at [facility]  2. Medical Oncologist: Dr. Humphrey Rolls (no longer at [facility]) 3. Radiation Oncologist: Dr. Pablo Ledger    PAST MEDICAL/SURGICAL HISTORY:  Past Medical History  Diagnosis Date  . Hx of colonoscopy   . Hx of bone density study   . Depression   . Breast cancer ([place])     RT BREAST  . Hypothyroidism   . History of radiation therapy 07/14/11-08/04/11    R breast,42.72Gy/2f a'@2'$ .67 Gy per fx  . H/O mammogram 05/16/12    B/L breasts digital MM  . Radiation 07/14/11-08/04/11    42.72 Gy Right Breast   Past Surgical History  Procedure Laterality Date  . Cataract extraction    . Tonsillectomy    . Eye surgery      BIL CATARACT AND RT RETINAL SX  . Breast surgery  06/15/2011    Right Br lumpectomy  . Abdominal hysterectomy      Complete     ALLERGIES:  No Known Allergies   CURRENT MEDICATIONS:  Current Outpatient Prescriptions  on File Prior to Visit  Medication Sig Dispense Refill  . calcium citrate (CALCITRATE - DOSED IN MG ELEMENTAL CALCIUM) 950 MG tablet Take 1 tablet by mouth daily.    . fish oil-omega-3 fatty acids 1000 MG capsule Take 1 g by mouth daily.    Marland Kitchen levothyroxine (SYNTHROID, LEVOTHROID) 50 MCG tablet Take by mouth daily.     Marland Kitchen MELATONIN PO Take 1 tablet by mouth at bedtime.     . Multiple Vitamin (MULTIVITAMIN) tablet Take 1  tablet by mouth daily.    . nortriptyline (PAMELOR) 50 MG capsule Take 100 mg by mouth daily.     No current facility-administered medications on file prior to visit.     ONCOLOGIC FAMILY HISTORY:  Family History  Problem Relation Age of Onset  . Breast cancer Sister   . Cancer Sister     breast  . Cancer Father     testicular ca     GENETIC COUNSELING/TESTING: No   SOCIAL HISTORY:  Patrici Minnis is single and lives alone in [place], [place].   Ms. Cockerill is currently retired.  She is a former smoker, having quit in 1988.  She drinks 2 oz of wine / nightly and denies any current or history of illicit drug use.    HEALTH MAINTENANCE: Colonoscopy: 2007 PAP: S/P hysterectomy   PHYSICAL EXAMINATION:  Vital Signs: Filed Vitals:   05/23/15 1252  BP: 151/70  Pulse: 84  Temp: 98.1 F (36.7 C)  Resp: 18   Weight: 134.2 (stable from October 2016). ECOG performance status: 0 General: Well-nourished, well-appearing female in no acute distress.  She is unaccompanied in clinic today.   HEENT: Head is atraumatic and normocephalic.  Pupils equal and reactive to light and accomodation. Conjunctivae clear without exudate.  Sclerae anicteric. Oral mucosa is pink, moist, and intact without lesions.  Oropharynx is pink without lesions or erythema.  Lymph: No cervical, supraclavicular, infraclavicular, or axillary lymphadenopathy noted on palpation.  Cardiovascular: Regular rate and rhythm without murmurs, rubs, or gallops. Respiratory: Clear to auscultation bilaterally. Chest expansion symmetric without accessory muscle use on inspiration or expiration.  Breast: Bilateral breast exam performed.  Thickening along right breast consistent with post radiation changes.  Stable central lumpectomy scar which is well healed.  No mass or lesion throughout remainder of right breast nor left breast. GI: Abdomen soft and round. No tenderness to palpation. Bowel sounds normoactive in 4 quadrants.  No hepatosplenomegaly.   GU: Deferred.  Musculoskeletal: Muscle strength 5/5 in all extremities.   Neuro: No focal deficits. Steady gait.  Psych: Mood and affect normal and appropriate for situation.  Extremities: No edema, cyanosis, or clubbing.  Skin: Warm and dry. No open lesions noted.   LABORATORY DATA:  No results found for this or any previous visit (from the past 2160 hour(s)).  DIAGNOSTIC IMAGING: Bilateral diagnostic mammogram performed 05/21/2015 reveals breast density category B.  Stable lumpectomy changes in right breast centrally without suspicious mass or lesion, or calcification in either breast.     ASSESSMENT AND PLAN:   1. 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  Ms. Askari is doing well with no clinical symptoms worrisome for cancer recurrence at this time. I have reviewed the recommendations for ongoing surveillance with her and she  will return in six month's time to see [place] in the Survivorship clinic. She will be due mammogram in April 2018 and I have entered orders for this to be scheduled. She was instructed to make [place] aware if she notes any change within her breast, any new symptoms such as pain, shortness of breath, weight loss, or fatigue prior to her return appointment in October 2017.  2. Cancer screening:  Due to Ms. Gahm history and her age, she should receive screening for skin cancers, colon cancer, and gynecologic cancers.  She will meet with Dr. Wynetta Emery to discuss colonoscopy in May 2017.  The information and recommendations were shared with the patient and in her written after visit summary.  3. Health maintenance and wellness promotion:Ms. Pearline Cables and I discussed recommendations to maximize  nutrition and minimize recurrence, such as increased intake of fruits, vegetables, lean proteins, and minimizing the intake of red meats and processed foods.  She was also encouraged to engage in moderate to vigorous exercise for 30 minutes per day most days of the week. She was instructed to limit her alcohol consumption and continue to abstain from tobacco use/.      A total of 30 minutes of face-to-face time was spent with this patient with greater than 50% of that time in counseling and care-coordination.   Sylvan Cheese, NP  Survivorship Program [facility] [phone]   Note: PRIMARY CARE PROVIDER [place], [place] [phone]

## 2015-05-23 NOTE — Patient Instructions (Signed)
Thank you for coming in today!  As we discussed, please continue to perform your self breast exam and report any changes. If you note any new symptoms (please see below), be sure to notify us ASAP.  Your mammogram will be due in April 2018 and we will enter orders for it today.  We'll have you return in 6 months time for your next appointment or sooner if you have any problems. Please be sure to stop by scheduling on your way out to make those appointment(s).  Looking forward to working with you in the future!  Let us know if you have any questions!  Symptoms to Watch for and Report to Your Provider  . Return of the cancer symptoms you had before- such as a lump or new growth where your cancer first started . New or unusual pain that seems unrelated to an injury and does not go away, including back pain or bone pain . Weight loss without trying/intending . Unexplained bleeding . A rash or allergic reaction, such as swelling, severe itching or wheezing . Chills or fevers . Persistent headaches . Shortness of breath or difficulty breathing . Bloody stools or blood in your urine . Lumps, bumps, swelling and/or nipple discharge . Nausea, vomiting, diarrhea, loss of appetite, or trouble swallowing . A cough that doesn't go away . Abdominal pain . Swelling in your arms or legs . Fractures . Hot flashes or other menopausal symptoms . Any other signs mentioned by your doctor or nurse or any unusual symptoms                 that you just can't explain   NOTE: Just because you have certain symptoms, it doesn't mean the cancer has come back or you have a new cancer. Symptoms can be due to other problems that need to be addressed.  It is important to watch for these symptoms and report them to your provider so you can be medically evaluated for any of these concerns!    Living a Life of Wellness After Cancer:  *Note: Please consult your health care provider before using any medications, supplements,  over-the-counter products, or other interventions.  Also, please consult your primary care provider before you begin any lifestyle program (diet, exercise, etc.).  Your safety is our top priority and we want to make sure you continue to live a long and healthy life!    Healthy Lifestyle Recommendations  As a cancer survivor, it is important develop a lifelong commitment to a healthy lifestyle. A healthy lifestyle can prevent cancer from returning as well as prevent other diseases like heart disease, diabetes and high blood pressure.  These are some things that you can do to have a healthy lifestyle:  Marland Kitchen Maintain a healthy weight.  . Exercise daily per your doctor's orders. . Eat a balanced diet high in fruits, vegetables, bran, and fiber. Limit intake of red meat      and processed foods.  . Limit how much alcohol you consume, if at all. Ali Lowe regular bone mineral density testing for osteoporosis.  . Talk to your doctor about cardiovascular disease or "heart disease" screening. . Stop smoking (if you smoke). . Know your family history. . Be mindful of your emotional, social, and spiritual needs. . Meet regularly with a Primary Care Provider (PCP). Find a PCP if you do not             already have one. . Talk to your doctor about  regular cancer screening including screening for colon           cancer, GYN cancers, and skin cancer.

## 2015-05-30 ENCOUNTER — Encounter: Payer: Medicare Other | Admitting: Nurse Practitioner

## 2015-06-20 DIAGNOSIS — H04123 Dry eye syndrome of bilateral lacrimal glands: Secondary | ICD-10-CM | POA: Diagnosis not present

## 2015-06-27 ENCOUNTER — Other Ambulatory Visit: Payer: Self-pay | Admitting: Gastroenterology

## 2015-09-01 ENCOUNTER — Encounter (HOSPITAL_COMMUNITY): Admission: RE | Payer: Self-pay | Source: Ambulatory Visit

## 2015-09-01 ENCOUNTER — Ambulatory Visit (HOSPITAL_COMMUNITY): Admission: RE | Admit: 2015-09-01 | Payer: Medicare Other | Source: Ambulatory Visit | Admitting: Gastroenterology

## 2015-09-01 SURGERY — COLONOSCOPY WITH PROPOFOL
Anesthesia: Monitor Anesthesia Care

## 2015-10-22 DIAGNOSIS — S0003XA Contusion of scalp, initial encounter: Secondary | ICD-10-CM | POA: Diagnosis not present

## 2015-10-22 DIAGNOSIS — W19XXXA Unspecified fall, initial encounter: Secondary | ICD-10-CM | POA: Diagnosis not present

## 2015-10-22 DIAGNOSIS — S0001XA Abrasion of scalp, initial encounter: Secondary | ICD-10-CM | POA: Diagnosis not present

## 2015-11-26 DIAGNOSIS — Z23 Encounter for immunization: Secondary | ICD-10-CM | POA: Diagnosis not present

## 2015-11-27 ENCOUNTER — Ambulatory Visit (HOSPITAL_BASED_OUTPATIENT_CLINIC_OR_DEPARTMENT_OTHER): Payer: Medicare Other | Admitting: Adult Health

## 2015-11-27 ENCOUNTER — Encounter: Payer: Self-pay | Admitting: Adult Health

## 2015-11-27 VITALS — BP 140/81 | HR 85 | Temp 98.6°F | Resp 18 | Ht 62.0 in | Wt 133.4 lb

## 2015-11-27 DIAGNOSIS — Z853 Personal history of malignant neoplasm of breast: Secondary | ICD-10-CM | POA: Diagnosis not present

## 2015-11-27 DIAGNOSIS — C50111 Malignant neoplasm of central portion of right female breast: Secondary | ICD-10-CM

## 2015-11-27 DIAGNOSIS — Z170 Estrogen receptor positive status [ER+]: Principal | ICD-10-CM

## 2015-11-27 NOTE — Progress Notes (Signed)
CLINIC:  Survivorship   REASON FOR VISIT:  Routine follow-up for history of breast cancer.   BRIEF ONCOLOGIC HISTORY:    Cancer of central portion of female breast, right.   05/24/2011 Mammogram    10 mm irregular hypoechoic mass located within the subareolar portion of the right breast worrisome for possible invasive mammary carcinoma.      05/26/2011 Initial Biopsy    Right breast core needle bx: Invasive ductal carcinoma, grade 1, ER+ (100%), PR+ (100%), HER2/neu negative (ratio 1.14), Ki67 12%       05/26/2011 Clinical Stage    Stage IA: T1b N0      06/15/2011 Definitive Surgery    Right central lumpectomy: Invasive ductal carcinoma, 1.3 cm, ER+ (100%), PR+ (100%), HER2/neu negative (ratio 1.19), Ki67 12%,  0/1 LN, negative margins      06/15/2011 Pathologic Stage    Stage IA: pT1c pN0      07/14/2011 - 08/04/2011 Radiation Therapy    Adjuvant RT: Right breast 42.72 Gy over 16 fractions       Anti-estrogen oral therapy    pt declined       INTERVAL HISTORY:  Samantha Weeks presents to the [facility] today for routine follow-up for her history of breast cancer.  Overall, she reports feeling quite well. She remains very active with her hobbies. She enjoys yoga for exercise. Her appetite and energy levels are good. She recently visited her optometrist, and will be getting glasses soon. She has had a hearing aid for about the last 3-4 years, which works well. Otherwise, she is largely without complaints.  Denies any changes in her breasts.  Denies vaginal bleeding.    REVIEW OF SYSTEMS:  Review of Systems  Constitutional: Negative.   HENT: Positive for hearing loss.        Wears hearing aids.  Eyes: Positive for blurred vision.       Recently got glasses.  Respiratory: Negative.   Cardiovascular: Negative.   Gastrointestinal: Negative.   Genitourinary: Negative.   Musculoskeletal: Negative.   Skin: Negative.   Neurological: Negative.   Endo/Heme/Allergies:  Negative.   Psychiatric/Behavioral: Negative.   Breast: Denies any new nodularity, masses, tenderness, nipple changes, or nipple discharge.    A 14-point review of systems was completed and was negative, except as noted above.    PAST MEDICAL/SURGICAL HISTORY:  Past Medical History:  Diagnosis Date  . Breast cancer ([place])    RT BREAST  . Depression   . H/O mammogram 05/16/12   B/L breasts digital MM  . History of radiation therapy 07/14/11-08/04/11   R breast,42.72Gy/17f a'@2'$ .67 Gy per fx  . Hx of bone density study   . Hx of colonoscopy   . Hypothyroidism   . Radiation 07/14/11-08/04/11   42.72 Gy Right Breast   Past Surgical History:  Procedure Laterality Date  . ABDOMINAL HYSTERECTOMY     Complete  . BREAST SURGERY  06/15/2011   Right Br lumpectomy  . CATARACT EXTRACTION    . EYE SURGERY     BIL CATARACT AND RT RETINAL SX  . TONSILLECTOMY       ALLERGIES:  No Known Allergies   CURRENT MEDICATIONS:  Outpatient Encounter Prescriptions as of 11/27/2015  Medication Sig  . levothyroxine (SYNTHROID, LEVOTHROID) 50 MCG tablet Take 50 mcg by mouth daily.   .Marland KitchenMISC NATURAL PRODUCTS PO Take 2 tablets by mouth daily. [facility]  . Multiple Vitamin (MULTIVITAMIN) tablet Take  1 tablet by mouth daily.  . nortriptyline (PAMELOR) 50 MG capsule Take 100 mg by mouth daily.   No facility-administered encounter medications on file as of 11/27/2015.      ONCOLOGIC FAMILY HISTORY:  Family History  Problem Relation Age of Onset  . Breast cancer Sister     2nd cancer (TNBC) dx'd in 11/2014  . Cancer Sister     breast  . Cancer Father     testicular ca    GENETIC COUNSELING/TESTING: No records available for review.  SOCIAL HISTORY:  Samantha Weeks is divorced and lives alone in [place], [place]. She is originally from [place], [place]. She has no children. She does have 3 cats. 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  She denies any current tobacco or illicit drug use.  She drinks about 2 ounces of wine per day.   PHYSICAL EXAMINATION:  Vital Signs: Vitals:   11/27/15 1345  BP: 140/81  Pulse: 85  Resp: 18  Temp: 98.6 F (37 C)   Filed Weights   11/27/15 1345  Weight: 133 lb 6.4 oz (60.5 kg)   General: Well-nourished, well-appearing female in no acute distress.  She is unaccompanied today.   HEENT: Head is normocephalic.  Pupils equal and reactive to light. Conjunctivae clear without exudate.  Sclerae anicteric. Oral mucosa is pink, moist.  Oropharynx is pink without lesions or erythema.  Lymph: No cervical, supraclavicular, or infraclavicular lymphadenopathy noted on palpation.  Cardiovascular: Regular rate and rhythm.Marland Kitchen Respiratory: Clear to auscultation bilaterally. Chest expansion symmetric; breathing non-labored.  Breast Exam:  -Left breast: No appreciable masses on palpation. No skin redness, thickening, or peau d'orange appearance; no nipple retraction or nipple discharge. -Right breast: No appreciable masses on palpation. No skin redness, thickening, or peau d'orange appearance; no nipple retraction or nipple discharge; mild distortion in symmetry at previous lumpectomy site; healed scar without erythema or nodularity. -Axilla: No axillary adenopathy bilaterally.  GI: Abdomen soft and round; non-tender, non-distended. Bowel sounds normoactive. No hepatosplenomegaly.   GU: Deferred.  Neuro: No focal deficits. Steady gait.  Psych: Mood and affect normal and appropriate for situation.  Extremities: No edema. Skin: Warm and dry.  LABORATORY DATA:  None for this visit.   DIAGNOSTIC IMAGING:  Most recent mammogram: 05/21/15    ASSESSMENT AND PLAN:  Samantha Weeks is a pleasant 75 y.o. female with history of IA right breast invasive ductal carcinoma, ER+/PR+/HER2-, diagnosed in  05/2011; treated with lumpectomy & adjuvant radiation therapy. Patient declined anti-estrogen therapy. She presents to the Survivorship Clinic for surveillance and routine follow-up.   1. History of Stage IA right breast cancer:  Samantha Weeks is currently clinically and radiographically without evidence of disease or recurrence of breast cancer. She will be due for annual diagnostic mammogram in 05/2016; orders placed today. She will see her surgeon, Dr. Lucia Gaskins, in 01/2016 as previously scheduled. She will return to the survivorship clinic in 06/2016 at which point she will be able to "graduate" from follow-up here at the cancer center. I encouraged her to call me with any questions or concerns before her next visit here, and I would be happy to see her sooner if needed.  2. Bone health:  Given Samantha Weeks age & history of breast cancer, she is at risk for bone demineralization. We do not have any DEXA scan imaging records. Since she has declined  adjuvant antiestrogen therapy, I will defer any future bone mineral density testing to her PCP, as clinically indicated.  In the meantime, she was encouraged to increase her consumption of foods rich in calcium, as well as increase her weight-bearing activities.  She was given education on specific food and activities to promote bone health.  3. Health maintenance and wellness promotion: Samantha Weeks was encouraged to consume 5-7 servings of fruits and vegetables per day. She was also encouraged to engage in moderate to vigorous exercise for 30 minutes per day most days of the week. I encouraged her to keep up the good work with yoga for her exercise. She sees her PCP regularly; with her annual physical done in 05/2015. She received her annual flu shot on 11/26/15. She is due for colonoscopy this year; she is working on getting this scheduled. She was instructed to limit her alcohol consumption and continue to abstain from tobacco use.    Dispo:  -Annual diagnostic mammogram  due in 05/2016; orders placed today. -Return to cancer center to see survivorship NP in 06/2016; at this visit she will have the option to "graduate" from follow-up at the cancer center.   A total of 20 minutes of face-to-face time was spent with this patient with greater than 50% of that time in counseling and care-coordination.   Mike Craze, NP Survivorship Program [place] [phone]   Note: PRIMARY CARE PROVIDER [place]enda [place], [place] [phone]

## 2015-11-28 ENCOUNTER — Encounter: Payer: Medicare Other | Admitting: Nurse Practitioner

## 2015-12-18 ENCOUNTER — Other Ambulatory Visit: Payer: Self-pay | Admitting: Gastroenterology

## 2016-01-27 ENCOUNTER — Encounter (HOSPITAL_COMMUNITY): Admission: RE | Payer: Self-pay | Source: Ambulatory Visit

## 2016-01-27 ENCOUNTER — Ambulatory Visit (HOSPITAL_COMMUNITY): Admission: RE | Admit: 2016-01-27 | Payer: Medicare Other | Source: Ambulatory Visit | Admitting: Gastroenterology

## 2016-01-27 SURGERY — COLONOSCOPY WITH PROPOFOL
Anesthesia: Monitor Anesthesia Care

## 2016-03-01 DIAGNOSIS — Z961 Presence of intraocular lens: Secondary | ICD-10-CM | POA: Diagnosis not present

## 2016-05-17 DIAGNOSIS — M8588 Other specified disorders of bone density and structure, other site: Secondary | ICD-10-CM | POA: Diagnosis not present

## 2016-05-17 DIAGNOSIS — C50911 Malignant neoplasm of unspecified site of right female breast: Secondary | ICD-10-CM | POA: Diagnosis not present

## 2016-05-17 DIAGNOSIS — Z1211 Encounter for screening for malignant neoplasm of colon: Secondary | ICD-10-CM | POA: Diagnosis not present

## 2016-05-17 DIAGNOSIS — H9190 Unspecified hearing loss, unspecified ear: Secondary | ICD-10-CM | POA: Diagnosis not present

## 2016-05-17 DIAGNOSIS — Z1389 Encounter for screening for other disorder: Secondary | ICD-10-CM | POA: Diagnosis not present

## 2016-05-17 DIAGNOSIS — Z0000 Encounter for general adult medical examination without abnormal findings: Secondary | ICD-10-CM | POA: Diagnosis not present

## 2016-05-17 DIAGNOSIS — F324 Major depressive disorder, single episode, in partial remission: Secondary | ICD-10-CM | POA: Diagnosis not present

## 2016-05-17 DIAGNOSIS — E7800 Pure hypercholesterolemia, unspecified: Secondary | ICD-10-CM | POA: Diagnosis not present

## 2016-05-17 DIAGNOSIS — G43909 Migraine, unspecified, not intractable, without status migrainosus: Secondary | ICD-10-CM | POA: Diagnosis not present

## 2016-05-17 DIAGNOSIS — E039 Hypothyroidism, unspecified: Secondary | ICD-10-CM | POA: Diagnosis not present

## 2016-05-21 ENCOUNTER — Encounter: Payer: Medicare Other | Admitting: Nurse Practitioner

## 2016-05-24 ENCOUNTER — Ambulatory Visit
Admission: RE | Admit: 2016-05-24 | Discharge: 2016-05-24 | Disposition: A | Discharge status: Home / Self Care | Payer: Medicare Other | Source: Ambulatory Visit | Attending: Adult Health | Admitting: Adult Health

## 2016-05-24 DIAGNOSIS — Z170 Estrogen receptor positive status [ER+]: Principal | ICD-10-CM

## 2016-05-24 DIAGNOSIS — R928 Other abnormal and inconclusive findings on diagnostic imaging of breast: Secondary | ICD-10-CM | POA: Diagnosis not present

## 2016-05-24 DIAGNOSIS — C50111 Malignant neoplasm of central portion of right female breast: Secondary | ICD-10-CM

## 2016-06-22 DIAGNOSIS — M8588 Other specified disorders of bone density and structure, other site: Secondary | ICD-10-CM | POA: Diagnosis not present

## 2016-06-24 ENCOUNTER — Ambulatory Visit (HOSPITAL_BASED_OUTPATIENT_CLINIC_OR_DEPARTMENT_OTHER): Payer: Medicare Other | Admitting: Adult Health

## 2016-06-24 ENCOUNTER — Encounter: Payer: Medicare Other | Admitting: Adult Health

## 2016-06-24 ENCOUNTER — Encounter: Payer: Self-pay | Admitting: Adult Health

## 2016-06-24 VITALS — BP 151/76 | HR 82 | Temp 98.6°F | Resp 17 | Ht 62.0 in | Wt 132.4 lb

## 2016-06-24 DIAGNOSIS — Z853 Personal history of malignant neoplasm of breast: Secondary | ICD-10-CM

## 2016-06-24 DIAGNOSIS — Z170 Estrogen receptor positive status [ER+]: Principal | ICD-10-CM

## 2016-06-24 DIAGNOSIS — C50111 Malignant neoplasm of central portion of right female breast: Secondary | ICD-10-CM

## 2016-06-24 NOTE — Progress Notes (Signed)
CLINIC:  Survivorship   REASON FOR VISIT:  Routine follow-up for history of breast cancer.   BRIEF ONCOLOGIC HISTORY:    Cancer of central portion of female breast, right.   05/24/2011 Mammogram    10 mm irregular hypoechoic mass located within the subareolar portion of the right breast worrisome for possible invasive mammary carcinoma.      05/26/2011 Initial Biopsy    Right breast core needle bx: Invasive ductal carcinoma, grade 1, ER+ (100%), PR+ (100%), HER2/neu negative (ratio 1.14), Ki67 12%       05/26/2011 Clinical Stage    Stage IA: T1b N0      06/15/2011 Definitive Surgery    Right central lumpectomy: Invasive ductal carcinoma, 1.3 cm, ER+ (100%), PR+ (100%), HER2/neu negative (ratio 1.19), Ki67 12%,  0/1 LN, negative margins      06/15/2011 Pathologic Stage    Stage IA: pT1c pN0      07/14/2011 - 08/04/2011 Radiation Therapy    Adjuvant RT: Right breast 42.72 Gy over 16 fractions       Anti-estrogen oral therapy    pt declined        INTERVAL HISTORY:  Ms. Briguglio presents to the [facility] today for routine follow-up for her history of breast cancer.  Overall, she reports feeling quite well. She is doing well today.  She has a PCP that she sees annually.  Her last mammogram was in April and was normal.  She exercising one to two times per week.  She is in the process of negotiating her colonoscopy with Dr. Lorenda Hatchet, and undergoes skin screening.      REVIEW OF SYSTEMS:  Review of Systems  Constitutional: Negative for appetite change, chills, diaphoresis, fatigue, fever and unexpected weight change.  HENT:   Negative for hearing loss and lump/mass.   Eyes: Negative for eye problems and icterus.  Respiratory: Negative for chest tightness, cough and shortness of breath.   Cardiovascular: Negative for chest pain, leg swelling and palpitations.  Gastrointestinal: Negative for abdominal distention, abdominal pain, constipation and diarrhea.  Endocrine:  Negative for hot flashes.  Musculoskeletal: Negative for arthralgias and gait problem.  Skin: Negative for itching and rash.  Neurological: Negative for dizziness, extremity weakness, gait problem and numbness.  Hematological: Negative for adenopathy. Does not bruise/bleed easily.  Psychiatric/Behavioral: Negative for sleep disturbance. The patient is not nervous/anxious.   Breast: Denies any new nodularity, masses, tenderness, nipple changes, or nipple discharge.        PAST MEDICAL/SURGICAL HISTORY:  Past Medical History:  Diagnosis Date  . Breast cancer ([place])    RT BREAST  . Depression   . H/O mammogram 05/16/12   B/L breasts digital MM  . History of radiation therapy 07/14/11-08/04/11   R breast,42.72Gy/49f a'@2'$ .67 Gy per fx  . Hx of bone density study   . Hx of colonoscopy   . Hypothyroidism   . Radiation 07/14/11-08/04/11   42.72 Gy Right Breast   Past Surgical History:  Procedure Laterality Date  . ABDOMINAL HYSTERECTOMY     Complete  . BREAST BIOPSY Right 05/26/2011  . BREAST LUMPECTOMY Right 06/15/2011  . BREAST SURGERY  06/15/2011   Right Br lumpectomy  . CATARACT EXTRACTION    . EYE SURGERY     BIL CATARACT AND RT RETINAL SX  . TONSILLECTOMY       ALLERGIES:  No Known Allergies   CURRENT MEDICATIONS:  Outpatient Encounter Prescriptions as of 06/24/2016  Medication Sig  . levothyroxine (SYNTHROID, LEVOTHROID)  50 MCG tablet Take 50 mcg by mouth daily.   Marland Kitchen MISC NATURAL PRODUCTS PO Take 2 tablets by mouth daily. Cocoa supplement - [facility] cancer study  . Multiple Vitamin (MULTIVITAMIN) tablet Take 1 tablet by mouth daily.  . nortriptyline (PAMELOR) 50 MG capsule Take 100 mg by mouth daily.   No facility-administered encounter medications on file as of 06/24/2016.      ONCOLOGIC FAMILY HISTORY:  Family History  Problem Relation Age of Onset  . Breast cancer Sister        2nd cancer (TNBC) dx'd in 11/2014  . Cancer Sister        breast    . Cancer Father        testicular ca    GENETIC COUNSELING/TESTING: None, not indicated  SOCIAL HISTORY:  Mehgan Santmyer is divorced and lives alone in [place], [place].  She has no children, her family lives in [place].  Ms. Balcerzak is currently retired.  She denies any current or history of tobacco, alcohol, or illicit drug use.     PHYSICAL EXAMINATION:  Vital Signs: Vitals:   06/24/16 1526  BP: (!) 151/76  Pulse: 82  Resp: 17  Temp: 98.6 F (37 C)   Filed Weights   06/24/16 1526  Weight: 132 lb 6.4 oz (60.1 kg)   General: Well-nourished, well-appearing female in no acute distress.  Unaccompanied today.   HEENT: Head is normocephalic.  Pupils equal and reactive to light. Conjunctivae clear without exudate.  Sclerae anicteric. Oral mucosa is pink, moist.  Oropharynx is pink without lesions or erythema.  Lymph: No cervical, supraclavicular, or infraclavicular lymphadenopathy noted on palpation.  Cardiovascular: Regular rate and rhythm.Marland Kitchen Respiratory: Clear to auscultation bilaterally. Chest expansion symmetric; breathing non-labored.  Breast Exam:  -Left breast: No appreciable masses on palpation. No skin redness, thickening, or peau d'orange appearance; no nipple retraction or nipple discharge;   -Right breast: No appreciable masses on palpation. No skin redness, thickening, or peau d'orange appearance; nipple is surgically absent; mild distortion in symmetry at previous lumpectomy site well healed scar without erythema or nodularity. -Axilla: No axillary adenopathy bilaterally.  GI: Abdomen soft and round; non-tender, non-distended. Bowel sounds normoactive. No hepatosplenomegaly.   GU: Deferred.  Neuro: No focal deficits. Steady gait.  Psych: Mood and affect normal and appropriate for situation.  Extremities: No edema. Skin: Warm and dry.  LABORATORY DATA:  None for this visit   DIAGNOSTIC IMAGING:  Most recent mammogram:    ASSESSMENT AND PLAN:  Ms.. Chalupa is a  pleasant 76 y.o. female with history of Stage IA right breast invasive ductal carcinoma, ER+/PR+/HER2-, diagnosed in 05/2011, treated with lumpectomy, adjuvant radiation therapy.  She presents to the [facility] for surveillance and routine follow-up.   1. History of breast cancer:  Ms. Paulsen is currently clinically and radiographically without evidence of disease or recurrence of breast cancer. She will be due for mammogram in 05/2017.  Due to her early stage disease, and very healthy lifestyle, she was graduated from the medical oncology office today and we will see her in the future if the need arises.  She will continue to follow with Dr. Lorenda Hatchet for her primary care needs.    2. Bone health:  Given Ms. Mincer age, history of breast cancer,  she is at risk for bone demineralization. Her last DEXA scan was on earlier this week and Dr. Lorenda Hatchet manages these.  In the meantime, she was encouraged to increase her consumption of  foods rich in calcium, as well as increase her weight-bearing activities.  She was given education on specific food and activities to promote bone health.  3. Cancer screening:  Due to Ms. Capes history and her age, she should receive screening for skin cancers, colon cancer, and cancers. She was encouraged to follow-up with her PCP for appropriate cancer screenings.   4. Health maintenance and wellness promotion: Ms. Ortego was encouraged to consume 5-7 servings of fruits and vegetables per day. She was also encouraged to engage in moderate to vigorous exercise for 30 minutes per day most days of the week. She was instructed to limit her alcohol consumption and continue to abstain from tobacco use.    Dispo:  -Return to cancer center PRN   A total of (30) minutes of face-to-face time was spent with this patient with greater than 50% of that time in counseling and care-coordination.   Gardenia Phlegm, NP Survivorship Program [facility] [phone]   Note: PRIMARY CARE PROVIDER Wenda Low, [place] [phone]

## 2016-11-01 DIAGNOSIS — Z1212 Encounter for screening for malignant neoplasm of rectum: Secondary | ICD-10-CM | POA: Diagnosis not present

## 2016-11-01 DIAGNOSIS — Z1211 Encounter for screening for malignant neoplasm of colon: Secondary | ICD-10-CM | POA: Diagnosis not present

## 2016-11-24 DIAGNOSIS — Z23 Encounter for immunization: Secondary | ICD-10-CM | POA: Diagnosis not present

## 2017-03-01 DIAGNOSIS — H04123 Dry eye syndrome of bilateral lacrimal glands: Secondary | ICD-10-CM | POA: Diagnosis not present

## 2017-03-01 DIAGNOSIS — Z961 Presence of intraocular lens: Secondary | ICD-10-CM | POA: Diagnosis not present

## 2017-04-14 ENCOUNTER — Other Ambulatory Visit: Payer: Self-pay | Admitting: Internal Medicine

## 2017-04-14 DIAGNOSIS — Z853 Personal history of malignant neoplasm of breast: Secondary | ICD-10-CM

## 2017-05-18 DIAGNOSIS — M8588 Other specified disorders of bone density and structure, other site: Secondary | ICD-10-CM | POA: Diagnosis not present

## 2017-05-18 DIAGNOSIS — F324 Major depressive disorder, single episode, in partial remission: Secondary | ICD-10-CM | POA: Diagnosis not present

## 2017-05-18 DIAGNOSIS — Z0000 Encounter for general adult medical examination without abnormal findings: Secondary | ICD-10-CM | POA: Diagnosis not present

## 2017-05-18 DIAGNOSIS — G43909 Migraine, unspecified, not intractable, without status migrainosus: Secondary | ICD-10-CM | POA: Diagnosis not present

## 2017-05-18 DIAGNOSIS — Z23 Encounter for immunization: Secondary | ICD-10-CM | POA: Diagnosis not present

## 2017-05-18 DIAGNOSIS — C50911 Malignant neoplasm of unspecified site of right female breast: Secondary | ICD-10-CM | POA: Diagnosis not present

## 2017-05-18 DIAGNOSIS — H9190 Unspecified hearing loss, unspecified ear: Secondary | ICD-10-CM | POA: Diagnosis not present

## 2017-05-18 DIAGNOSIS — Z1389 Encounter for screening for other disorder: Secondary | ICD-10-CM | POA: Diagnosis not present

## 2017-05-18 DIAGNOSIS — E039 Hypothyroidism, unspecified: Secondary | ICD-10-CM | POA: Diagnosis not present

## 2017-05-18 DIAGNOSIS — E7800 Pure hypercholesterolemia, unspecified: Secondary | ICD-10-CM | POA: Diagnosis not present

## 2017-05-26 ENCOUNTER — Ambulatory Visit
Admission: RE | Admit: 2017-05-26 | Discharge: 2017-05-26 | Disposition: A | Discharge status: Home / Self Care | Payer: Medicare Other | Source: Ambulatory Visit | Attending: Internal Medicine | Admitting: Internal Medicine

## 2017-05-26 ENCOUNTER — Other Ambulatory Visit: Payer: Self-pay | Admitting: Internal Medicine

## 2017-05-26 DIAGNOSIS — Z1231 Encounter for screening mammogram for malignant neoplasm of breast: Secondary | ICD-10-CM | POA: Diagnosis not present

## 2017-05-26 DIAGNOSIS — Z853 Personal history of malignant neoplasm of breast: Secondary | ICD-10-CM

## 2017-10-20 DIAGNOSIS — Z23 Encounter for immunization: Secondary | ICD-10-CM | POA: Diagnosis not present

## 2017-12-29 DIAGNOSIS — H04123 Dry eye syndrome of bilateral lacrimal glands: Secondary | ICD-10-CM | POA: Diagnosis not present

## 2018-04-17 ENCOUNTER — Other Ambulatory Visit: Payer: Self-pay | Admitting: Internal Medicine

## 2018-04-17 DIAGNOSIS — Z1231 Encounter for screening mammogram for malignant neoplasm of breast: Secondary | ICD-10-CM

## 2018-04-26 DIAGNOSIS — H26492 Other secondary cataract, left eye: Secondary | ICD-10-CM | POA: Diagnosis not present

## 2018-05-12 DIAGNOSIS — H04123 Dry eye syndrome of bilateral lacrimal glands: Secondary | ICD-10-CM | POA: Diagnosis not present

## 2018-05-24 DIAGNOSIS — E7800 Pure hypercholesterolemia, unspecified: Secondary | ICD-10-CM | POA: Diagnosis not present

## 2018-05-24 DIAGNOSIS — Z0000 Encounter for general adult medical examination without abnormal findings: Secondary | ICD-10-CM | POA: Diagnosis not present

## 2018-05-24 DIAGNOSIS — M8588 Other specified disorders of bone density and structure, other site: Secondary | ICD-10-CM | POA: Diagnosis not present

## 2018-05-24 DIAGNOSIS — G43909 Migraine, unspecified, not intractable, without status migrainosus: Secondary | ICD-10-CM | POA: Diagnosis not present

## 2018-05-24 DIAGNOSIS — F324 Major depressive disorder, single episode, in partial remission: Secondary | ICD-10-CM | POA: Diagnosis not present

## 2018-05-24 DIAGNOSIS — C50911 Malignant neoplasm of unspecified site of right female breast: Secondary | ICD-10-CM | POA: Diagnosis not present

## 2018-05-24 DIAGNOSIS — E039 Hypothyroidism, unspecified: Secondary | ICD-10-CM | POA: Diagnosis not present

## 2018-05-29 ENCOUNTER — Ambulatory Visit: Payer: Medicare Other

## 2018-05-30 DIAGNOSIS — E7800 Pure hypercholesterolemia, unspecified: Secondary | ICD-10-CM | POA: Diagnosis not present

## 2018-05-30 DIAGNOSIS — E039 Hypothyroidism, unspecified: Secondary | ICD-10-CM | POA: Diagnosis not present

## 2018-05-30 DIAGNOSIS — C50911 Malignant neoplasm of unspecified site of right female breast: Secondary | ICD-10-CM | POA: Diagnosis not present

## 2018-07-17 ENCOUNTER — Other Ambulatory Visit: Payer: Self-pay

## 2018-07-17 ENCOUNTER — Ambulatory Visit
Admission: RE | Admit: 2018-07-17 | Discharge: 2018-07-17 | Disposition: A | Discharge status: Home / Self Care | Payer: Medicare Other | Source: Ambulatory Visit | Attending: Internal Medicine | Admitting: Internal Medicine

## 2018-07-17 DIAGNOSIS — Z1231 Encounter for screening mammogram for malignant neoplasm of breast: Secondary | ICD-10-CM

## 2018-11-23 DIAGNOSIS — D229 Melanocytic nevi, unspecified: Secondary | ICD-10-CM | POA: Diagnosis not present

## 2018-11-23 DIAGNOSIS — M8588 Other specified disorders of bone density and structure, other site: Secondary | ICD-10-CM | POA: Diagnosis not present

## 2018-11-23 DIAGNOSIS — F324 Major depressive disorder, single episode, in partial remission: Secondary | ICD-10-CM | POA: Diagnosis not present

## 2018-11-23 DIAGNOSIS — Z23 Encounter for immunization: Secondary | ICD-10-CM | POA: Diagnosis not present

## 2018-11-23 DIAGNOSIS — H9190 Unspecified hearing loss, unspecified ear: Secondary | ICD-10-CM | POA: Diagnosis not present

## 2018-11-23 DIAGNOSIS — E039 Hypothyroidism, unspecified: Secondary | ICD-10-CM | POA: Diagnosis not present

## 2018-11-23 DIAGNOSIS — L03019 Cellulitis of unspecified finger: Secondary | ICD-10-CM | POA: Diagnosis not present

## 2018-11-23 DIAGNOSIS — C50911 Malignant neoplasm of unspecified site of right female breast: Secondary | ICD-10-CM | POA: Diagnosis not present

## 2018-11-30 DIAGNOSIS — H26492 Other secondary cataract, left eye: Secondary | ICD-10-CM | POA: Diagnosis not present

## 2018-11-30 DIAGNOSIS — H524 Presbyopia: Secondary | ICD-10-CM | POA: Diagnosis not present

## 2018-12-04 DIAGNOSIS — B0089 Other herpesviral infection: Secondary | ICD-10-CM | POA: Diagnosis not present

## 2018-12-04 DIAGNOSIS — L820 Inflamed seborrheic keratosis: Secondary | ICD-10-CM | POA: Diagnosis not present

## 2018-12-21 DIAGNOSIS — H26491 Other secondary cataract, right eye: Secondary | ICD-10-CM | POA: Diagnosis not present

## 2019-03-03 ENCOUNTER — Ambulatory Visit: Payer: Medicare Other

## 2019-05-24 ENCOUNTER — Other Ambulatory Visit: Payer: Self-pay | Admitting: Internal Medicine

## 2019-05-24 DIAGNOSIS — N898 Other specified noninflammatory disorders of vagina: Secondary | ICD-10-CM | POA: Diagnosis not present

## 2019-05-24 DIAGNOSIS — F324 Major depressive disorder, single episode, in partial remission: Secondary | ICD-10-CM | POA: Diagnosis not present

## 2019-05-24 DIAGNOSIS — E7800 Pure hypercholesterolemia, unspecified: Secondary | ICD-10-CM | POA: Diagnosis not present

## 2019-05-24 DIAGNOSIS — Z0000 Encounter for general adult medical examination without abnormal findings: Secondary | ICD-10-CM | POA: Diagnosis not present

## 2019-05-24 DIAGNOSIS — M8588 Other specified disorders of bone density and structure, other site: Secondary | ICD-10-CM

## 2019-05-24 DIAGNOSIS — E039 Hypothyroidism, unspecified: Secondary | ICD-10-CM | POA: Diagnosis not present

## 2019-05-24 DIAGNOSIS — Z853 Personal history of malignant neoplasm of breast: Secondary | ICD-10-CM | POA: Diagnosis not present

## 2019-05-29 ENCOUNTER — Other Ambulatory Visit: Payer: Self-pay | Admitting: Internal Medicine

## 2019-05-29 DIAGNOSIS — Z1231 Encounter for screening mammogram for malignant neoplasm of breast: Secondary | ICD-10-CM

## 2019-07-16 DIAGNOSIS — L920 Granuloma annulare: Secondary | ICD-10-CM | POA: Diagnosis not present

## 2019-07-16 DIAGNOSIS — Z1283 Encounter for screening for malignant neoplasm of skin: Secondary | ICD-10-CM | POA: Diagnosis not present

## 2019-08-06 ENCOUNTER — Other Ambulatory Visit: Payer: Medicare Other

## 2019-08-06 ENCOUNTER — Ambulatory Visit: Payer: Medicare Other

## 2019-09-05 DIAGNOSIS — H31091 Other chorioretinal scars, right eye: Secondary | ICD-10-CM | POA: Diagnosis not present

## 2019-09-05 DIAGNOSIS — H524 Presbyopia: Secondary | ICD-10-CM | POA: Diagnosis not present

## 2019-09-05 DIAGNOSIS — Z961 Presence of intraocular lens: Secondary | ICD-10-CM | POA: Diagnosis not present

## 2019-10-09 ENCOUNTER — Ambulatory Visit
Admission: RE | Admit: 2019-10-09 | Discharge: 2019-10-09 | Disposition: A | Discharge status: Home / Self Care | Payer: Medicare Other | Source: Ambulatory Visit | Attending: Internal Medicine | Admitting: Internal Medicine

## 2019-10-09 ENCOUNTER — Other Ambulatory Visit: Payer: Self-pay

## 2019-10-09 DIAGNOSIS — Z1231 Encounter for screening mammogram for malignant neoplasm of breast: Secondary | ICD-10-CM

## 2019-10-09 DIAGNOSIS — M85851 Other specified disorders of bone density and structure, right thigh: Secondary | ICD-10-CM | POA: Diagnosis not present

## 2019-10-09 DIAGNOSIS — M8588 Other specified disorders of bone density and structure, other site: Secondary | ICD-10-CM

## 2019-10-09 DIAGNOSIS — Z780 Asymptomatic menopausal state: Secondary | ICD-10-CM | POA: Diagnosis not present

## 2019-11-04 DIAGNOSIS — Z23 Encounter for immunization: Secondary | ICD-10-CM | POA: Diagnosis not present

## 2019-11-08 DIAGNOSIS — Z23 Encounter for immunization: Secondary | ICD-10-CM | POA: Diagnosis not present

## 2019-11-26 DIAGNOSIS — M8588 Other specified disorders of bone density and structure, other site: Secondary | ICD-10-CM | POA: Diagnosis not present

## 2019-11-26 DIAGNOSIS — F330 Major depressive disorder, recurrent, mild: Secondary | ICD-10-CM | POA: Diagnosis not present

## 2019-11-26 DIAGNOSIS — E039 Hypothyroidism, unspecified: Secondary | ICD-10-CM | POA: Diagnosis not present

## 2019-11-26 DIAGNOSIS — Z853 Personal history of malignant neoplasm of breast: Secondary | ICD-10-CM | POA: Diagnosis not present

## 2020-05-26 DIAGNOSIS — Z853 Personal history of malignant neoplasm of breast: Secondary | ICD-10-CM | POA: Diagnosis not present

## 2020-05-26 DIAGNOSIS — M8588 Other specified disorders of bone density and structure, other site: Secondary | ICD-10-CM | POA: Diagnosis not present

## 2020-05-26 DIAGNOSIS — E7800 Pure hypercholesterolemia, unspecified: Secondary | ICD-10-CM | POA: Diagnosis not present

## 2020-05-26 DIAGNOSIS — Z0000 Encounter for general adult medical examination without abnormal findings: Secondary | ICD-10-CM | POA: Diagnosis not present

## 2020-05-26 DIAGNOSIS — E559 Vitamin D deficiency, unspecified: Secondary | ICD-10-CM | POA: Diagnosis not present

## 2020-05-26 DIAGNOSIS — Z1389 Encounter for screening for other disorder: Secondary | ICD-10-CM | POA: Diagnosis not present

## 2020-05-26 DIAGNOSIS — G43909 Migraine, unspecified, not intractable, without status migrainosus: Secondary | ICD-10-CM | POA: Diagnosis not present

## 2020-05-26 DIAGNOSIS — F330 Major depressive disorder, recurrent, mild: Secondary | ICD-10-CM | POA: Diagnosis not present

## 2020-05-26 DIAGNOSIS — H9190 Unspecified hearing loss, unspecified ear: Secondary | ICD-10-CM | POA: Diagnosis not present

## 2020-05-26 DIAGNOSIS — E039 Hypothyroidism, unspecified: Secondary | ICD-10-CM | POA: Diagnosis not present

## 2020-06-04 ENCOUNTER — Other Ambulatory Visit: Payer: Self-pay

## 2020-06-04 ENCOUNTER — Other Ambulatory Visit (HOSPITAL_BASED_OUTPATIENT_CLINIC_OR_DEPARTMENT_OTHER): Payer: Self-pay

## 2020-06-04 ENCOUNTER — Ambulatory Visit: Payer: Medicare Other | Attending: Internal Medicine

## 2020-06-04 DIAGNOSIS — Z23 Encounter for immunization: Secondary | ICD-10-CM

## 2020-06-04 MED ORDER — PFIZER-BIONT COVID-19 VAC-TRIS 30 MCG/0.3ML IM SUSP
INTRAMUSCULAR | 0 refills | Status: AC
  Filled 2020-06-04: qty 0.3, 1d supply, fill #0

## 2020-06-04 NOTE — Progress Notes (Signed)
   Covid-19 Vaccination Clinic  Name:  Samantha Weeks    MRN: [id number] DOB: 1940-11-11  06/04/2020  [name] was observed post Covid-19 immunization for 15 minutes without incident. She was provided with Vaccine Information Sheet and instruction to access the V-Safe system.   [name] was instructed to call 911 with any severe reactions post vaccine: Marland Kitchen Difficulty breathing  . Swelling of face and throat  . A fast heartbeat  . A bad rash all over body  . Dizziness and weakness   Immunizations Administered    Name Date Dose VIS Date Route   PFIZER Comrnaty(Flamm TOP) Covid-19 Vaccine 06/04/2020  2:47 PM 0.3 mL 01/17/2020 Intramuscular   Manufacturer: [place]   Lot: [id number]   [place]: [phone]

## 2020-06-06 ENCOUNTER — Other Ambulatory Visit (HOSPITAL_BASED_OUTPATIENT_CLINIC_OR_DEPARTMENT_OTHER): Payer: Self-pay

## 2020-09-16 ENCOUNTER — Other Ambulatory Visit: Payer: Self-pay | Admitting: Internal Medicine

## 2020-09-16 DIAGNOSIS — Z1231 Encounter for screening mammogram for malignant neoplasm of breast: Secondary | ICD-10-CM

## 2020-10-09 ENCOUNTER — Other Ambulatory Visit: Payer: Self-pay

## 2020-10-09 ENCOUNTER — Ambulatory Visit
Admission: RE | Admit: 2020-10-09 | Discharge: 2020-10-09 | Disposition: A | Discharge status: Home / Self Care | Payer: Medicare Other | Source: Ambulatory Visit | Attending: Internal Medicine | Admitting: Internal Medicine

## 2020-10-09 DIAGNOSIS — Z1231 Encounter for screening mammogram for malignant neoplasm of breast: Secondary | ICD-10-CM

## 2020-10-20 DIAGNOSIS — Z961 Presence of intraocular lens: Secondary | ICD-10-CM | POA: Diagnosis not present

## 2020-10-20 DIAGNOSIS — H524 Presbyopia: Secondary | ICD-10-CM | POA: Diagnosis not present

## 2020-10-20 DIAGNOSIS — H43813 Vitreous degeneration, bilateral: Secondary | ICD-10-CM | POA: Diagnosis not present

## 2020-10-20 DIAGNOSIS — H31091 Other chorioretinal scars, right eye: Secondary | ICD-10-CM | POA: Diagnosis not present

## 2020-11-07 DIAGNOSIS — Z23 Encounter for immunization: Secondary | ICD-10-CM | POA: Diagnosis not present

## 2020-11-11 ENCOUNTER — Other Ambulatory Visit (HOSPITAL_BASED_OUTPATIENT_CLINIC_OR_DEPARTMENT_OTHER): Payer: Self-pay

## 2020-11-11 ENCOUNTER — Other Ambulatory Visit: Payer: Self-pay

## 2020-11-11 ENCOUNTER — Ambulatory Visit: Payer: Medicare Other | Attending: Internal Medicine

## 2020-11-11 DIAGNOSIS — Z23 Encounter for immunization: Secondary | ICD-10-CM

## 2020-11-11 MED ORDER — MODERNA COVID-19 BIVAL BOOSTER 50 MCG/0.5ML IM SUSP
INTRAMUSCULAR | 0 refills | Status: AC
  Filled 2020-11-11: qty 0.5, 1d supply, fill #0

## 2020-11-11 NOTE — Progress Notes (Signed)
   Covid-19 Vaccination Clinic  Name:  Samantha Weeks    MRN: [id number] DOB: 09/07/1940  11/11/2020  [name] was observed post Covid-19 immunization for 15 minutes without incident. She was provided with Vaccine Information Sheet and instruction to access the V-Safe system.   [name] was instructed to call 911 with any severe reactions post vaccine: Difficulty breathing  Swelling of face and throat  A fast heartbeat  A bad rash all over body  Dizziness and weakness

## 2020-12-09 ENCOUNTER — Other Ambulatory Visit (HOSPITAL_BASED_OUTPATIENT_CLINIC_OR_DEPARTMENT_OTHER): Payer: Self-pay

## 2020-12-09 MED ORDER — ZOSTER VAC RECOMB ADJUVANTED 50 MCG/0.5ML IM SUSR
INTRAMUSCULAR | 0 refills | Status: AC
  Filled 2020-12-09: qty 0.5, 1d supply, fill #0

## 2020-12-10 ENCOUNTER — Other Ambulatory Visit (HOSPITAL_BASED_OUTPATIENT_CLINIC_OR_DEPARTMENT_OTHER): Payer: Self-pay

## 2020-12-11 ENCOUNTER — Other Ambulatory Visit (HOSPITAL_BASED_OUTPATIENT_CLINIC_OR_DEPARTMENT_OTHER): Payer: Self-pay

## 2021-03-27 DIAGNOSIS — R300 Dysuria: Secondary | ICD-10-CM | POA: Diagnosis not present

## 2021-05-28 DIAGNOSIS — Z1389 Encounter for screening for other disorder: Secondary | ICD-10-CM | POA: Diagnosis not present

## 2021-05-28 DIAGNOSIS — Z0000 Encounter for general adult medical examination without abnormal findings: Secondary | ICD-10-CM | POA: Diagnosis not present

## 2021-05-28 DIAGNOSIS — E7800 Pure hypercholesterolemia, unspecified: Secondary | ICD-10-CM | POA: Diagnosis not present

## 2021-05-28 DIAGNOSIS — Z853 Personal history of malignant neoplasm of breast: Secondary | ICD-10-CM | POA: Diagnosis not present

## 2021-05-28 DIAGNOSIS — E039 Hypothyroidism, unspecified: Secondary | ICD-10-CM | POA: Diagnosis not present

## 2021-05-28 DIAGNOSIS — M8588 Other specified disorders of bone density and structure, other site: Secondary | ICD-10-CM | POA: Diagnosis not present

## 2021-07-24 DIAGNOSIS — T07XXXA Unspecified multiple injuries, initial encounter: Secondary | ICD-10-CM | POA: Diagnosis not present

## 2021-07-24 DIAGNOSIS — S01511A Laceration without foreign body of lip, initial encounter: Secondary | ICD-10-CM | POA: Diagnosis not present

## 2021-09-09 ENCOUNTER — Other Ambulatory Visit: Payer: Self-pay | Admitting: Internal Medicine

## 2021-09-09 DIAGNOSIS — Z1231 Encounter for screening mammogram for malignant neoplasm of breast: Secondary | ICD-10-CM

## 2021-10-13 ENCOUNTER — Ambulatory Visit
Admission: RE | Admit: 2021-10-13 | Discharge: 2021-10-13 | Disposition: A | Discharge status: Home / Self Care | Payer: Medicare Other | Source: Ambulatory Visit | Attending: Internal Medicine | Admitting: Internal Medicine

## 2021-10-13 DIAGNOSIS — Z1231 Encounter for screening mammogram for malignant neoplasm of breast: Secondary | ICD-10-CM | POA: Diagnosis not present

## 2021-10-13 HISTORY — DX: Personal history of irradiation: Z92.3

## 2021-10-28 DIAGNOSIS — H524 Presbyopia: Secondary | ICD-10-CM | POA: Diagnosis not present

## 2021-10-28 DIAGNOSIS — H43813 Vitreous degeneration, bilateral: Secondary | ICD-10-CM | POA: Diagnosis not present

## 2021-10-28 DIAGNOSIS — H31091 Other chorioretinal scars, right eye: Secondary | ICD-10-CM | POA: Diagnosis not present

## 2021-10-28 DIAGNOSIS — Z961 Presence of intraocular lens: Secondary | ICD-10-CM | POA: Diagnosis not present

## 2021-10-29 DIAGNOSIS — H43812 Vitreous degeneration, left eye: Secondary | ICD-10-CM | POA: Diagnosis not present

## 2021-10-29 DIAGNOSIS — H353131 Nonexudative age-related macular degeneration, bilateral, early dry stage: Secondary | ICD-10-CM | POA: Diagnosis not present

## 2021-10-29 DIAGNOSIS — H31091 Other chorioretinal scars, right eye: Secondary | ICD-10-CM | POA: Diagnosis not present

## 2021-10-29 DIAGNOSIS — H33322 Round hole, left eye: Secondary | ICD-10-CM | POA: Diagnosis not present

## 2022-04-20 IMAGING — MG MM DIGITAL SCREENING BILAT W/ TOMO AND CAD
8 series · 8 of 24 positions shown · non-contrast
Comparison: Previous exam(s).

CLINICAL DATA: Screening.

EXAM:
DIGITAL SCREENING BILATERAL MAMMOGRAM WITH TOMOSYNTHESIS AND CAD
TECHNIQUE: Bilateral screening digital craniocaudal and mediolateral oblique
mammograms were obtained. Bilateral screening digital breast
tomosynthesis was performed. The images were evaluated with
computer-aided detection.

[R CC synth-2D]
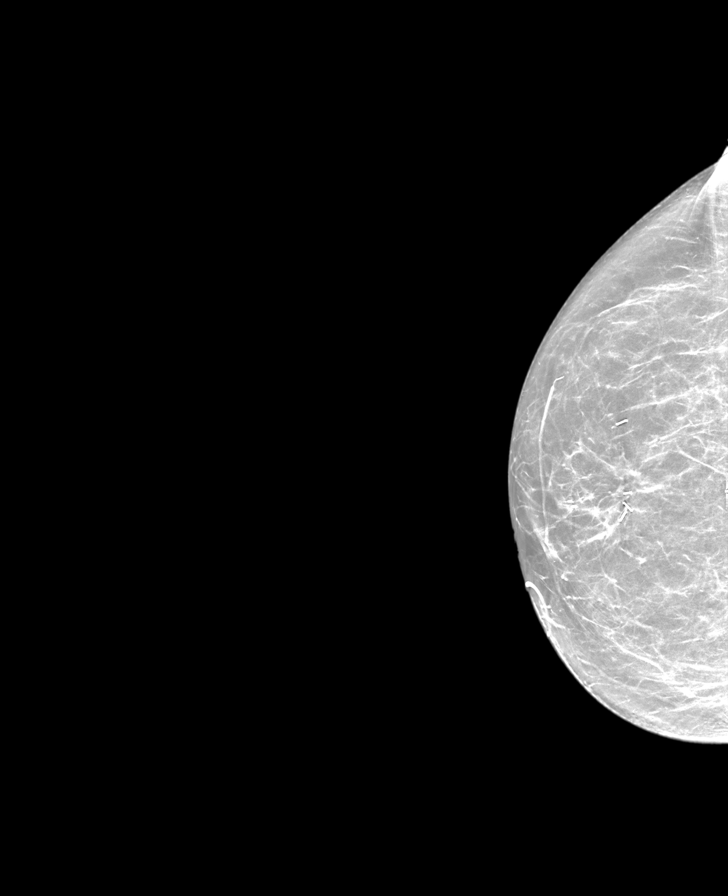

[L MLO synth-2D]
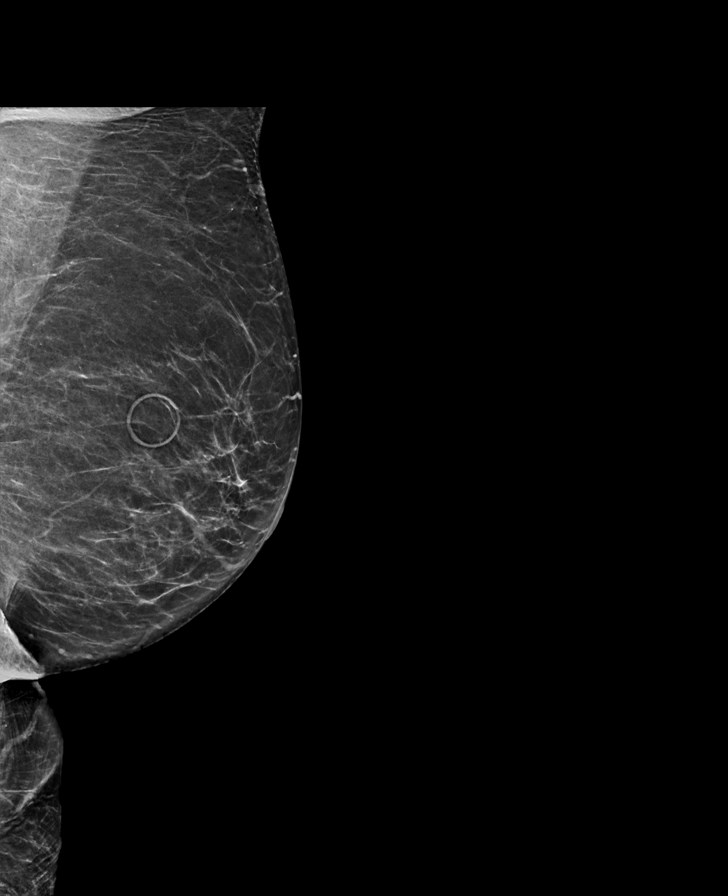

[R MLO synth-2D]
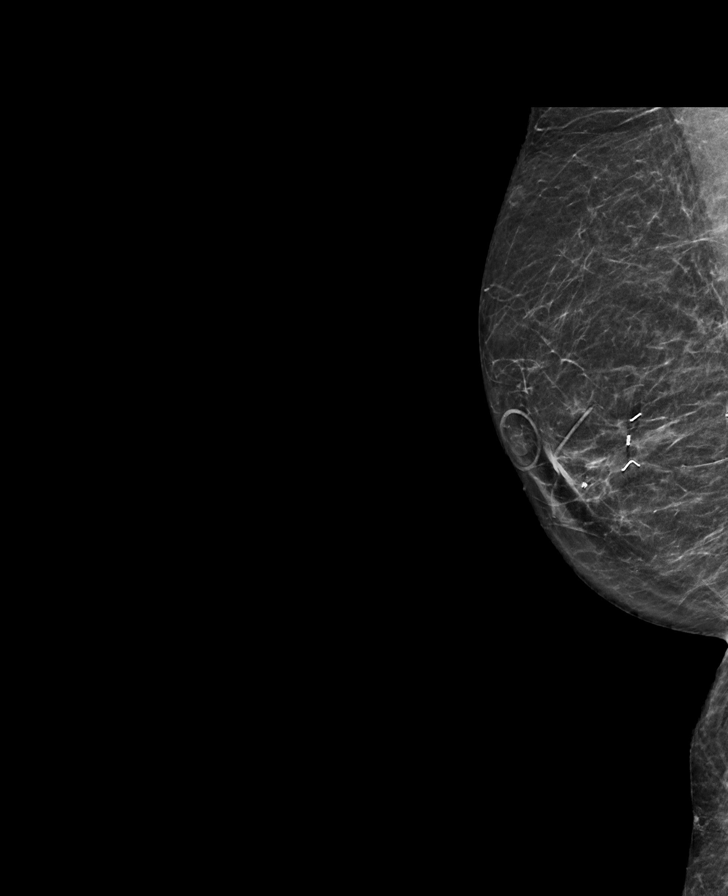

[L CC synth-2D]
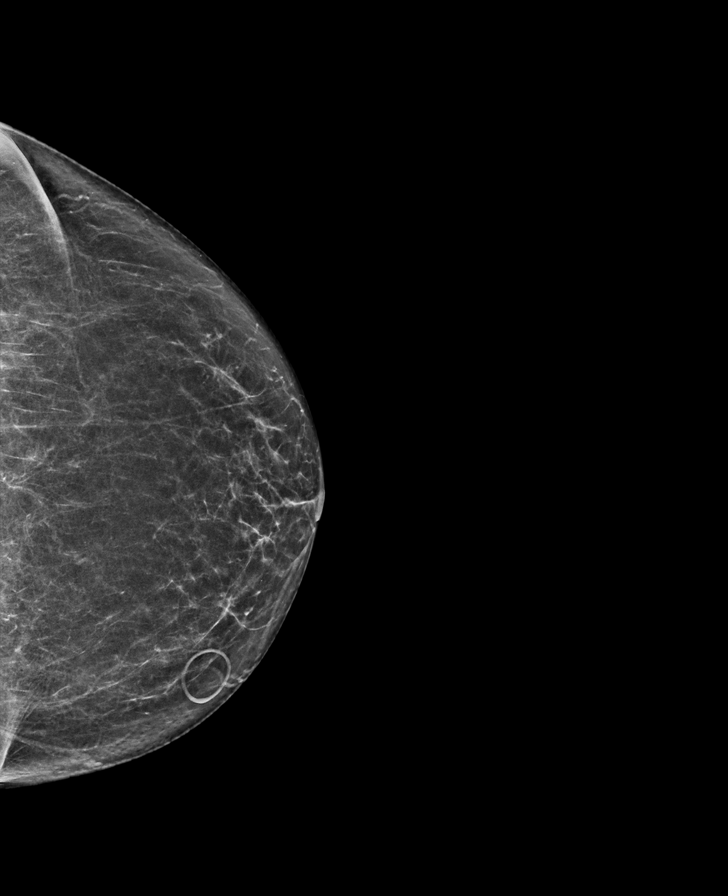

[R CC tomo · tomo slice 33/64.0]
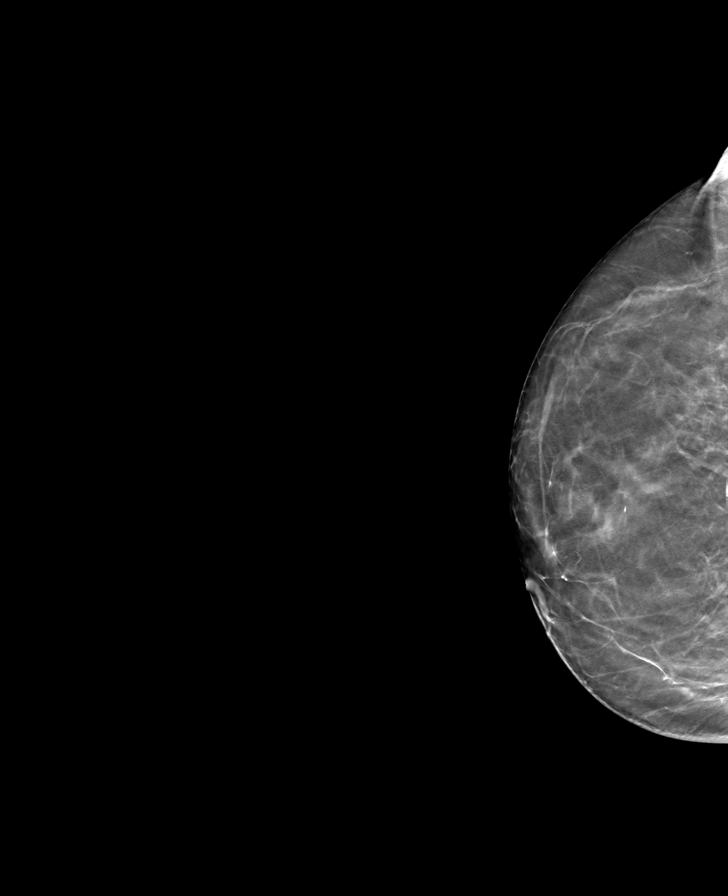

[L CC tomo · tomo slice 32/63.0]
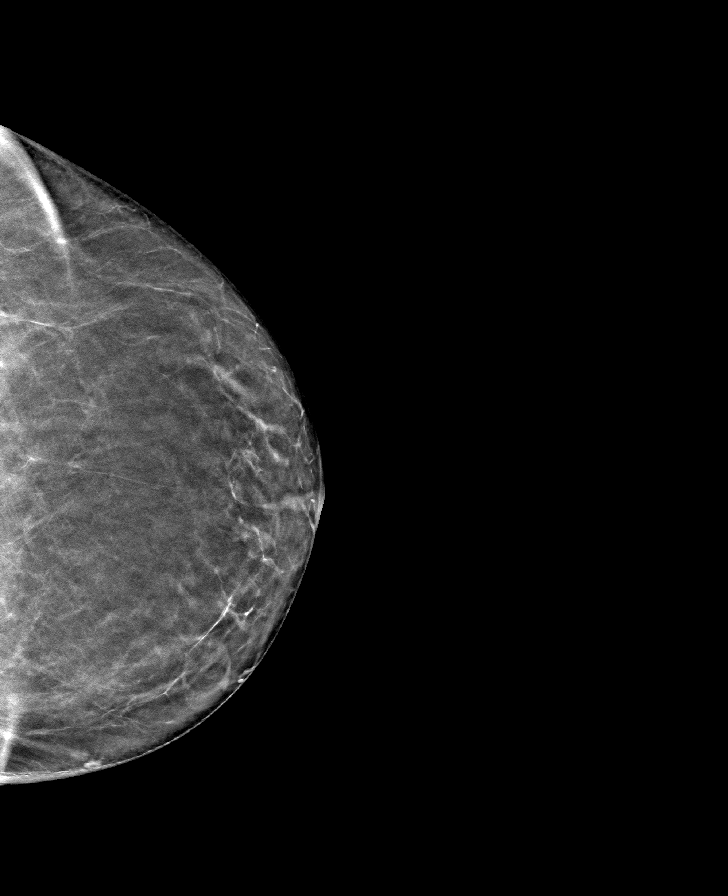

[R MLO tomo · tomo slice 31/61.0]
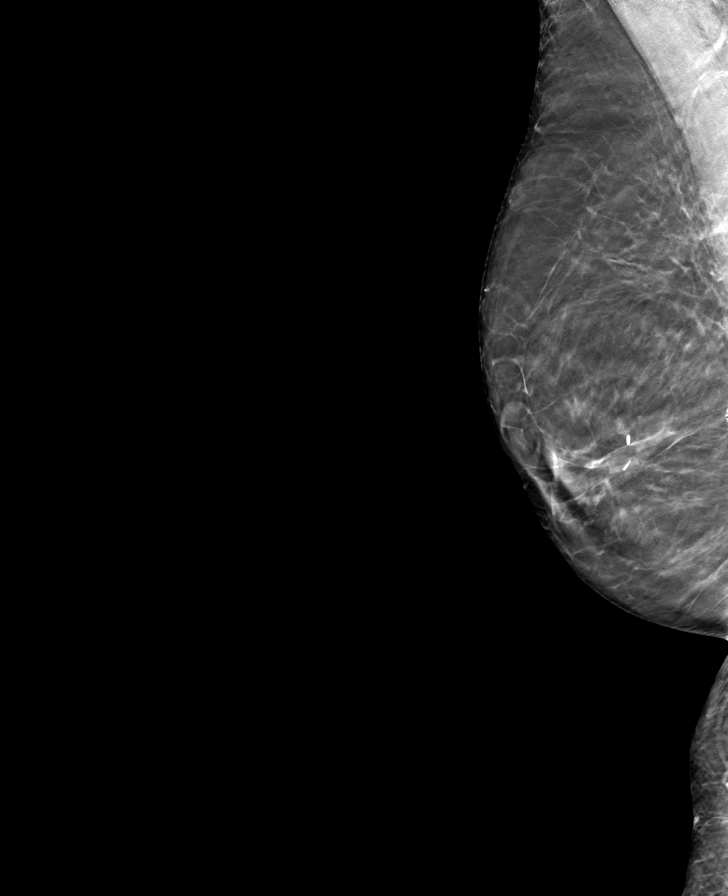

[L MLO tomo · tomo slice 33/64.0]
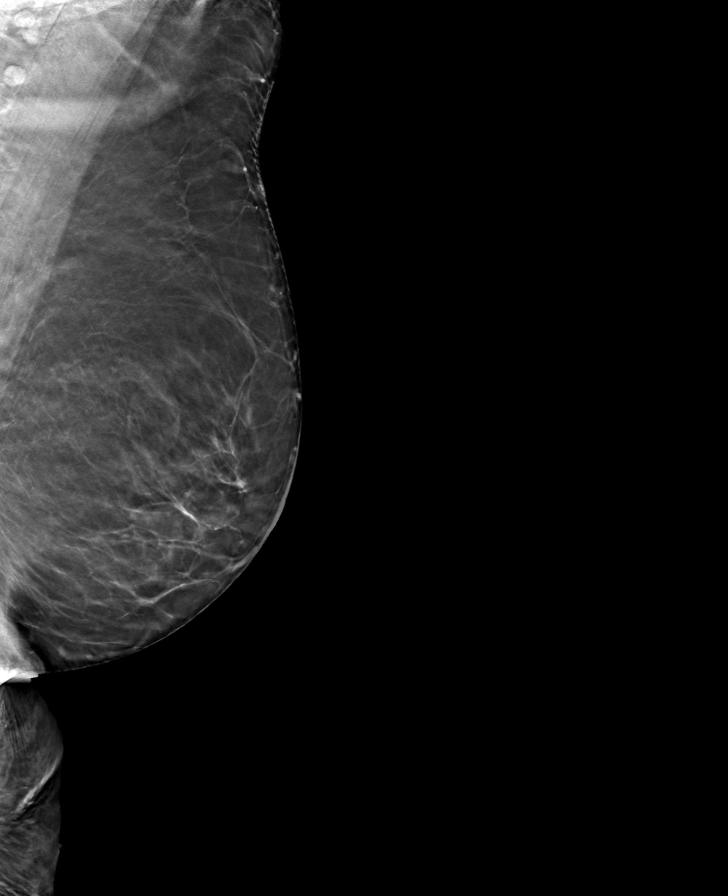

[8 of 24 positions shown; findings below may reference images not displayed]

ACR Breast Density Category b: There are scattered areas of
fibroglandular density.
FINDINGS: There are no findings suspicious for malignancy.
IMPRESSION: No mammographic evidence of malignancy. A result letter of this
screening mammogram will be mailed directly to the patient.

RECOMMENDATION:
Screening mammogram in one year. (Code:51-O-LD2)

BI-RADS CATEGORY  1: Negative.

## 2022-04-27 DIAGNOSIS — K080 Exfoliation of teeth due to systemic causes: Secondary | ICD-10-CM | POA: Diagnosis not present

## 2022-05-10 DIAGNOSIS — K080 Exfoliation of teeth due to systemic causes: Secondary | ICD-10-CM | POA: Diagnosis not present

## 2022-06-01 DIAGNOSIS — E7800 Pure hypercholesterolemia, unspecified: Secondary | ICD-10-CM | POA: Diagnosis not present

## 2022-06-01 DIAGNOSIS — F330 Major depressive disorder, recurrent, mild: Secondary | ICD-10-CM | POA: Diagnosis not present

## 2022-06-01 DIAGNOSIS — E039 Hypothyroidism, unspecified: Secondary | ICD-10-CM | POA: Diagnosis not present

## 2022-06-01 DIAGNOSIS — Z853 Personal history of malignant neoplasm of breast: Secondary | ICD-10-CM | POA: Diagnosis not present

## 2022-06-01 DIAGNOSIS — E559 Vitamin D deficiency, unspecified: Secondary | ICD-10-CM | POA: Diagnosis not present

## 2022-06-01 DIAGNOSIS — Z0000 Encounter for general adult medical examination without abnormal findings: Secondary | ICD-10-CM | POA: Diagnosis not present

## 2022-06-01 DIAGNOSIS — M8588 Other specified disorders of bone density and structure, other site: Secondary | ICD-10-CM | POA: Diagnosis not present

## 2022-06-01 DIAGNOSIS — Z1331 Encounter for screening for depression: Secondary | ICD-10-CM | POA: Diagnosis not present

## 2022-07-02 ENCOUNTER — Other Ambulatory Visit: Payer: Self-pay | Admitting: Internal Medicine

## 2022-07-02 DIAGNOSIS — M8580 Other specified disorders of bone density and structure, unspecified site: Secondary | ICD-10-CM

## 2022-08-04 DIAGNOSIS — I10 Essential (primary) hypertension: Secondary | ICD-10-CM | POA: Diagnosis not present

## 2022-09-15 DIAGNOSIS — I10 Essential (primary) hypertension: Secondary | ICD-10-CM | POA: Diagnosis not present

## 2022-09-20 ENCOUNTER — Other Ambulatory Visit: Payer: Self-pay | Admitting: Internal Medicine

## 2022-09-20 DIAGNOSIS — Z0000 Encounter for general adult medical examination without abnormal findings: Secondary | ICD-10-CM

## 2022-10-15 ENCOUNTER — Ambulatory Visit: Payer: Medicare Other

## 2022-11-03 DIAGNOSIS — H31091 Other chorioretinal scars, right eye: Secondary | ICD-10-CM | POA: Diagnosis not present

## 2022-11-03 DIAGNOSIS — H524 Presbyopia: Secondary | ICD-10-CM | POA: Diagnosis not present

## 2022-11-03 DIAGNOSIS — H43813 Vitreous degeneration, bilateral: Secondary | ICD-10-CM | POA: Diagnosis not present

## 2022-11-03 DIAGNOSIS — H43312 Vitreous membranes and strands, left eye: Secondary | ICD-10-CM | POA: Diagnosis not present

## 2022-11-27 DIAGNOSIS — Z23 Encounter for immunization: Secondary | ICD-10-CM | POA: Diagnosis not present

## 2022-11-29 DIAGNOSIS — K080 Exfoliation of teeth due to systemic causes: Secondary | ICD-10-CM | POA: Diagnosis not present

## 2023-01-21 ENCOUNTER — Ambulatory Visit
Admission: RE | Admit: 2023-01-21 | Discharge: 2023-01-21 | Disposition: A | Discharge status: Home / Self Care | Payer: Medicare Other | Source: Ambulatory Visit | Attending: Internal Medicine | Admitting: Internal Medicine

## 2023-01-21 DIAGNOSIS — N958 Other specified menopausal and perimenopausal disorders: Secondary | ICD-10-CM | POA: Diagnosis not present

## 2023-01-21 DIAGNOSIS — Z1231 Encounter for screening mammogram for malignant neoplasm of breast: Secondary | ICD-10-CM | POA: Diagnosis not present

## 2023-01-21 DIAGNOSIS — Z0000 Encounter for general adult medical examination without abnormal findings: Secondary | ICD-10-CM

## 2023-01-21 DIAGNOSIS — Z90722 Acquired absence of ovaries, bilateral: Secondary | ICD-10-CM | POA: Diagnosis not present

## 2023-01-21 DIAGNOSIS — E2839 Other primary ovarian failure: Secondary | ICD-10-CM | POA: Diagnosis not present

## 2023-01-21 DIAGNOSIS — M8580 Other specified disorders of bone density and structure, unspecified site: Secondary | ICD-10-CM

## 2023-01-21 DIAGNOSIS — M8588 Other specified disorders of bone density and structure, other site: Secondary | ICD-10-CM | POA: Diagnosis not present

## 2023-02-08 DIAGNOSIS — N3090 Cystitis, unspecified without hematuria: Secondary | ICD-10-CM | POA: Diagnosis not present

## 2023-03-04 DIAGNOSIS — I10 Essential (primary) hypertension: Secondary | ICD-10-CM | POA: Diagnosis not present

## 2023-03-04 DIAGNOSIS — R300 Dysuria: Secondary | ICD-10-CM | POA: Diagnosis not present

## 2023-04-07 ENCOUNTER — Ambulatory Visit: Payer: Self-pay | Admitting: Podiatry

## 2023-04-07 ENCOUNTER — Encounter: Payer: Self-pay | Admitting: Podiatry

## 2023-04-07 DIAGNOSIS — M21619 Bunion of unspecified foot: Secondary | ICD-10-CM | POA: Diagnosis not present

## 2023-04-07 DIAGNOSIS — L989 Disorder of the skin and subcutaneous tissue, unspecified: Secondary | ICD-10-CM | POA: Diagnosis not present

## 2023-04-07 DIAGNOSIS — M216X2 Other acquired deformities of left foot: Secondary | ICD-10-CM

## 2023-04-07 NOTE — Patient Instructions (Signed)
 For inserts I like POWERSTEPS, SUPERFEET, AETREX   Choose a moisturizer from  the list below:  For normal skin: Moisturize feet once daily; do not apply between toes A.  CeraVe Daily Moisturizing Lotion B.  Lubriderm Advanced Therapy Lotion or Lubriderm Intense Skin Repair Lotion C.  Aquaphor Intensive Repair Lotion D.  Gold Bond Ultimate Diabetic Foot Lotion E.  Eucerin Intensive Repair Moisturizing Lotion  For extremely dry, cracked feet: moisturize feet once daily; do not apply between toes A. CeraVe Healing Ointment B. Eucerin Aquaphor Repairing Ointment (may be labeled Aquaphor Healing Ointment) C. Vaseline Petroleum Healing Jelly   If you have problems reaching your feet: apply to feet once daily; do not apply between toes A.  Eucerin Aquaphor Ointment Body Spray  B.  Vaseline Intensive Care Spray Moisturizer (Unscented,  Cocoa Radiant Spray or Aloe Smooth Spray)

## 2023-04-07 NOTE — Progress Notes (Unsigned)
 Subjective:   Patient ID: Samantha Weeks, female   DOB: 83 y.o.   MRN: [id number]   HPI Chief Complaint  Patient presents with   Foot Pain    RM#11 Patient present today with an area on the bottom of her foot she thinks it is a corn noticed it about 1 month or so.   83 year old female presents the office today for the above concerns which started at 4 to 6 weeks ago.  She states in the morning inserts she thinks that may be the culprit.  Although she has had the orthotic for a long time it is quite old and she needs it typically on this.  No recent injuries and denies stepping any foreign objects.  No recent treatment.  She has no other concerns.    Review of Systems  All other systems reviewed and are negative.  Past Medical History:  Diagnosis Date   Breast cancer ([facility])    RT BREAST   Depression    H/O mammogram 05/16/2012   B/L breasts digital MM   History of radiation therapy 07/14/11-08/04/11   R breast,42.72Gy/44fx a@2 .67 Gy per fx   Hx of bone density study    Hx of colonoscopy    Hypothyroidism    Personal history of radiation therapy    Radiation 07/14/11-08/04/11   42.72 Gy Right Breast    Past Surgical History:  Procedure Laterality Date   ABDOMINAL HYSTERECTOMY     Complete   BREAST BIOPSY Right 05/26/2011   BREAST LUMPECTOMY Right 06/15/2011   BREAST SURGERY  06/15/2011   Right Br lumpectomy   CATARACT EXTRACTION     EYE SURGERY     BIL CATARACT AND RT RETINAL SX   TONSILLECTOMY       Current Outpatient Medications:    COVID-19 mRNA bivalent vaccine, Moderna, (MODERNA COVID-19 BIVAL BOOSTER) 50 MCG/0.5ML injection, Inject into the muscle., Disp: 0.5 mL, Rfl: 0   COVID-19 mRNA Vac-TriS, Pfizer, (PFIZER-BIONT COVID-19 VAC-TRIS) SUSP injection, Inject into the muscle., Disp: 0.3 mL, Rfl: 0   levothyroxine (SYNTHROID, LEVOTHROID) 50 MCG tablet, Take 50 mcg by mouth daily. , Disp: , Rfl:    MISC NATURAL PRODUCTS PO, Take 2 tablets by mouth daily. Cocoa supplement -  [facility] cancer study, Disp: , Rfl:    Multiple Vitamin (MULTIVITAMIN) tablet, Take 1 tablet by mouth daily., Disp: , Rfl:    nortriptyline (PAMELOR) 50 MG capsule, Take 100 mg by mouth daily., Disp: , Rfl:    Zoster Vaccine Adjuvanted (SHINGRIX) injection, Inject into the muscle., Disp: 0.5 mL, Rfl: 0  No Known Allergies        Objective:  Physical Exam  General: AAO x3, NAD  Dermatological: On the left foot submetatarsal 1 is a hyperkeratotic lesion with punctate annular lesion noted without any underlying ulceration, drainage or any signs of infection.  Minimal callus on the right submetatarsal 1.  There is no open lesions present.  Vascular: Dorsalis Pedis artery and Posterior Tibial artery pedal pulses are 2/4 bilateral with immedate capillary fill time.  There is no pain with calf compression, swelling, warmth, erythema.   Neruologic: Grossly intact via light touch bilateral.   Musculoskeletal: Bunion to present bilaterally with prominent metatarsal heads plantarly.      Assessment:   83 year old female with skin lesion, prominent metatarsal head, bunion     Plan:  -Treatment options discussed including all alternatives, risks, and complications -Etiology of symptoms were discussed -This should be debrided hyperkeratotic  lesion to the any complications or bleeding as courtesy.  We discussed moisturizer, offloading.  We discussed different types of inserts help decrease pressure.  If needed consider custom inserts.  Vivi Barrack DPM

## 2023-05-09 DIAGNOSIS — J069 Acute upper respiratory infection, unspecified: Secondary | ICD-10-CM | POA: Diagnosis not present

## 2023-06-02 DIAGNOSIS — Z23 Encounter for immunization: Secondary | ICD-10-CM | POA: Diagnosis not present

## 2023-06-02 DIAGNOSIS — Z853 Personal history of malignant neoplasm of breast: Secondary | ICD-10-CM | POA: Diagnosis not present

## 2023-06-02 DIAGNOSIS — Z0000 Encounter for general adult medical examination without abnormal findings: Secondary | ICD-10-CM | POA: Diagnosis not present

## 2023-06-02 DIAGNOSIS — I10 Essential (primary) hypertension: Secondary | ICD-10-CM | POA: Diagnosis not present

## 2023-06-02 DIAGNOSIS — E7800 Pure hypercholesterolemia, unspecified: Secondary | ICD-10-CM | POA: Diagnosis not present

## 2023-06-02 DIAGNOSIS — Z1331 Encounter for screening for depression: Secondary | ICD-10-CM | POA: Diagnosis not present

## 2023-06-02 DIAGNOSIS — E039 Hypothyroidism, unspecified: Secondary | ICD-10-CM | POA: Diagnosis not present

## 2023-06-02 DIAGNOSIS — E559 Vitamin D deficiency, unspecified: Secondary | ICD-10-CM | POA: Diagnosis not present

## 2023-06-02 DIAGNOSIS — F331 Major depressive disorder, recurrent, moderate: Secondary | ICD-10-CM | POA: Diagnosis not present

## 2023-08-29 DIAGNOSIS — K080 Exfoliation of teeth due to systemic causes: Secondary | ICD-10-CM | POA: Diagnosis not present

## 2023-10-13 DIAGNOSIS — M5136 Other intervertebral disc degeneration, lumbar region with discogenic back pain only: Secondary | ICD-10-CM | POA: Diagnosis not present

## 2023-10-13 DIAGNOSIS — M5432 Sciatica, left side: Secondary | ICD-10-CM | POA: Diagnosis not present

## 2023-10-13 DIAGNOSIS — M419 Scoliosis, unspecified: Secondary | ICD-10-CM | POA: Diagnosis not present

## 2023-10-13 DIAGNOSIS — M5126 Other intervertebral disc displacement, lumbar region: Secondary | ICD-10-CM | POA: Diagnosis not present

## 2023-10-13 DIAGNOSIS — M545 Low back pain, unspecified: Secondary | ICD-10-CM | POA: Diagnosis not present

## 2023-10-17 ENCOUNTER — Ambulatory Visit (HOSPITAL_BASED_OUTPATIENT_CLINIC_OR_DEPARTMENT_OTHER)
Admission: RE | Admit: 2023-10-17 | Discharge: 2023-10-17 | Disposition: A | Discharge status: Home / Self Care | Source: Ambulatory Visit | Attending: Internal Medicine | Admitting: Internal Medicine

## 2023-10-17 ENCOUNTER — Other Ambulatory Visit (HOSPITAL_BASED_OUTPATIENT_CLINIC_OR_DEPARTMENT_OTHER): Payer: Self-pay | Admitting: Internal Medicine

## 2023-10-17 DIAGNOSIS — R918 Other nonspecific abnormal finding of lung field: Secondary | ICD-10-CM | POA: Diagnosis not present

## 2023-10-17 DIAGNOSIS — R160 Hepatomegaly, not elsewhere classified: Secondary | ICD-10-CM | POA: Diagnosis not present

## 2023-10-17 DIAGNOSIS — R932 Abnormal findings on diagnostic imaging of liver and biliary tract: Secondary | ICD-10-CM | POA: Diagnosis not present

## 2023-10-17 DIAGNOSIS — K769 Liver disease, unspecified: Secondary | ICD-10-CM | POA: Diagnosis not present

## 2023-10-17 DIAGNOSIS — M545 Low back pain, unspecified: Secondary | ICD-10-CM | POA: Diagnosis not present

## 2023-10-17 DIAGNOSIS — M419 Scoliosis, unspecified: Secondary | ICD-10-CM | POA: Diagnosis not present

## 2023-10-19 DIAGNOSIS — M549 Dorsalgia, unspecified: Secondary | ICD-10-CM | POA: Diagnosis not present

## 2023-10-19 DIAGNOSIS — M519 Unspecified thoracic, thoracolumbar and lumbosacral intervertebral disc disorder: Secondary | ICD-10-CM | POA: Diagnosis not present

## 2023-10-19 DIAGNOSIS — M419 Scoliosis, unspecified: Secondary | ICD-10-CM | POA: Diagnosis not present

## 2023-11-02 DIAGNOSIS — M549 Dorsalgia, unspecified: Secondary | ICD-10-CM | POA: Diagnosis not present

## 2023-11-02 DIAGNOSIS — M519 Unspecified thoracic, thoracolumbar and lumbosacral intervertebral disc disorder: Secondary | ICD-10-CM | POA: Diagnosis not present

## 2023-11-24 DIAGNOSIS — M5451 Vertebrogenic low back pain: Secondary | ICD-10-CM | POA: Diagnosis not present

## 2023-11-25 DIAGNOSIS — H524 Presbyopia: Secondary | ICD-10-CM | POA: Diagnosis not present

## 2023-11-28 DIAGNOSIS — K080 Exfoliation of teeth due to systemic causes: Secondary | ICD-10-CM | POA: Diagnosis not present

## 2023-11-30 DIAGNOSIS — M5451 Vertebrogenic low back pain: Secondary | ICD-10-CM | POA: Diagnosis not present

## 2023-12-08 DIAGNOSIS — M5451 Vertebrogenic low back pain: Secondary | ICD-10-CM | POA: Diagnosis not present

## 2023-12-22 ENCOUNTER — Other Ambulatory Visit: Payer: Self-pay | Admitting: Internal Medicine

## 2023-12-22 DIAGNOSIS — M5451 Vertebrogenic low back pain: Secondary | ICD-10-CM | POA: Diagnosis not present

## 2023-12-22 DIAGNOSIS — Z1231 Encounter for screening mammogram for malignant neoplasm of breast: Secondary | ICD-10-CM

## 2024-01-24 ENCOUNTER — Ambulatory Visit

## 2024-01-26 DIAGNOSIS — K080 Exfoliation of teeth due to systemic causes: Secondary | ICD-10-CM | POA: Diagnosis not present

## 2024-02-15 ENCOUNTER — Ambulatory Visit
Admission: RE | Admit: 2024-02-15 | Discharge: 2024-02-15 | Disposition: A | Discharge status: Home / Self Care | Source: Ambulatory Visit | Attending: Internal Medicine | Admitting: Internal Medicine

## 2024-02-15 DIAGNOSIS — Z1231 Encounter for screening mammogram for malignant neoplasm of breast: Secondary | ICD-10-CM
